# Patient Record
Sex: Male | Born: 1970 | Race: Black or African American | Hispanic: No | Marital: Single | Smoking: Never smoker
Health system: Southern US, Community
[De-identification: ages and names within clinical notes are randomized; demographics above are authoritative.]

## PROBLEM LIST (undated history)

## (undated) DIAGNOSIS — K859 Acute pancreatitis without necrosis or infection, unspecified: Secondary | ICD-10-CM

## (undated) DIAGNOSIS — I10 Essential (primary) hypertension: Secondary | ICD-10-CM

## (undated) DIAGNOSIS — E119 Type 2 diabetes mellitus without complications: Secondary | ICD-10-CM

## (undated) HISTORY — PX: HERNIA REPAIR: SHX51

---

## 2019-10-11 ENCOUNTER — Emergency Department (HOSPITAL_COMMUNITY)
Admission: EM | Admit: 2019-10-11 | Discharge: 2019-10-11 | Disposition: A | Discharge status: Home / Self Care | Payer: 59 | Attending: Emergency Medicine | Admitting: Emergency Medicine

## 2019-10-11 ENCOUNTER — Emergency Department (HOSPITAL_COMMUNITY): Payer: 59

## 2019-10-11 ENCOUNTER — Encounter (HOSPITAL_COMMUNITY): Payer: Self-pay | Admitting: Emergency Medicine

## 2019-10-11 DIAGNOSIS — Y999 Unspecified external cause status: Secondary | ICD-10-CM | POA: Diagnosis not present

## 2019-10-11 DIAGNOSIS — Y92411 Interstate highway as the place of occurrence of the external cause: Secondary | ICD-10-CM | POA: Diagnosis not present

## 2019-10-11 DIAGNOSIS — I10 Essential (primary) hypertension: Secondary | ICD-10-CM | POA: Insufficient documentation

## 2019-10-11 DIAGNOSIS — M549 Dorsalgia, unspecified: Secondary | ICD-10-CM | POA: Insufficient documentation

## 2019-10-11 DIAGNOSIS — Y93I9 Activity, other involving external motion: Secondary | ICD-10-CM | POA: Insufficient documentation

## 2019-10-11 DIAGNOSIS — E119 Type 2 diabetes mellitus without complications: Secondary | ICD-10-CM | POA: Diagnosis not present

## 2019-10-11 DIAGNOSIS — M542 Cervicalgia: Secondary | ICD-10-CM | POA: Diagnosis not present

## 2019-10-11 HISTORY — DX: Type 2 diabetes mellitus without complications: E11.9

## 2019-10-11 HISTORY — DX: Essential (primary) hypertension: I10

## 2019-10-11 MED ORDER — CYCLOBENZAPRINE HCL 10 MG PO TABS
10.0000 mg | ORAL_TABLET | Freq: Two times a day (BID) | ORAL | 0 refills | Status: DC | PRN
Start: 2019-10-11 — End: 2020-08-23

## 2019-10-11 MED ORDER — NAPROXEN 375 MG PO TABS
375.0000 mg | ORAL_TABLET | Freq: Two times a day (BID) | ORAL | 0 refills | Status: DC
Start: 2019-10-11 — End: 2020-08-23
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
## 2020-01-25 ENCOUNTER — Emergency Department (HOSPITAL_COMMUNITY)
Admission: EM | Admit: 2020-01-25 | Discharge: 2020-01-25 | Disposition: A | Discharge status: Home / Self Care | Payer: 59 | Attending: Emergency Medicine | Admitting: Emergency Medicine

## 2020-01-25 ENCOUNTER — Emergency Department (HOSPITAL_COMMUNITY): Payer: 59

## 2020-01-25 ENCOUNTER — Encounter (HOSPITAL_COMMUNITY): Payer: Self-pay

## 2020-01-25 ENCOUNTER — Other Ambulatory Visit: Payer: Self-pay

## 2020-01-25 DIAGNOSIS — I10 Essential (primary) hypertension: Secondary | ICD-10-CM | POA: Insufficient documentation

## 2020-01-25 DIAGNOSIS — E119 Type 2 diabetes mellitus without complications: Secondary | ICD-10-CM | POA: Diagnosis not present

## 2020-01-25 DIAGNOSIS — R1011 Right upper quadrant pain: Secondary | ICD-10-CM

## 2020-01-25 DIAGNOSIS — K529 Noninfective gastroenteritis and colitis, unspecified: Secondary | ICD-10-CM | POA: Diagnosis not present

## 2020-01-25 HISTORY — DX: Acute pancreatitis without necrosis or infection, unspecified: K85.90

## 2020-01-25 LAB — COMPREHENSIVE METABOLIC PANEL
ALT: 26 U/L (ref 0–44)
AST: 15 U/L (ref 15–41)
Albumin: 4.9 g/dL (ref 3.5–5.0)
Alkaline Phosphatase: 62 U/L (ref 38–126)
Anion gap: 12 (ref 5–15)
BUN: 16 mg/dL (ref 6–20)
CO2: 26 mmol/L (ref 22–32)
Calcium: 9.8 mg/dL (ref 8.9–10.3)
Chloride: 100 mmol/L (ref 98–111)
Creatinine, Ser: 1.11 mg/dL (ref 0.61–1.24)
GFR, Estimated: >60 mL/min (ref >60)
Glucose, Bld: 235 mg/dL — ABNORMAL HIGH (ref 70–99)
Potassium: 3.9 mmol/L (ref 3.5–5.1)
Sodium: 138 mmol/L (ref 135–145)
Total Bilirubin: 1 mg/dL (ref 0.3–1.2)
Total Protein: 7.8 g/dL (ref 6.5–8.1)

## 2020-01-25 LAB — URINALYSIS, ROUTINE W REFLEX MICROSCOPIC
Bacteria, UA: NONE SEEN
Bilirubin Urine: NEGATIVE
Glucose, UA: 50 mg/dL — AB
Hgb urine dipstick: NEGATIVE
Ketones, ur: 5 mg/dL — AB
Leukocytes,Ua: NEGATIVE
Nitrite: NEGATIVE
Protein, ur: 30 mg/dL — AB
Specific Gravity, Urine: 1.034 — ABNORMAL HIGH (ref 1.005–1.030)
pH: 5 (ref 5.0–8.0)

## 2020-01-25 LAB — CBC
HCT: 46.9 % (ref 39.0–52.0)
Hemoglobin: 15.6 g/dL (ref 13.0–17.0)
MCH: 29.8 pg (ref 26.0–34.0)
MCHC: 33.3 g/dL (ref 30.0–36.0)
MCV: 89.7 fL (ref 80.0–100.0)
Platelets: 229 10*3/uL (ref 150–400)
RBC: 5.23 MIL/uL (ref 4.22–5.81)
RDW: 12.3 % (ref 11.5–15.5)
WBC: 5.2 10*3/uL (ref 4.0–10.5)
nRBC: 0 % (ref 0.0–0.2)

## 2020-01-25 LAB — LIPASE, BLOOD: Lipase: 39 U/L (ref 11–51)

## 2020-01-25 MED ORDER — SODIUM CHLORIDE 0.9 % IV BOLUS
1000.0000 mL | Freq: Once | INTRAVENOUS | Status: AC
  Administered 2020-01-25: 1000 mL via INTRAVENOUS

## 2020-01-25 MED ORDER — ONDANSETRON 4 MG PO TBDP
4.0000 mg | ORAL_TABLET | Freq: Three times a day (TID) | ORAL | 0 refills | Status: DC | PRN
Start: 2020-01-25 — End: 2020-02-15

## 2020-01-25 MED ORDER — MORPHINE SULFATE (PF) 4 MG/ML IV SOLN
4.0000 mg | Freq: Once | INTRAVENOUS | Status: AC
  Administered 2020-01-25: 4 mg via INTRAVENOUS
  Filled 2020-01-25: qty 1

## 2020-01-25 MED ORDER — ONDANSETRON HCL 4 MG/2ML IJ SOLN
4.0000 mg | Freq: Once | INTRAMUSCULAR | Status: AC
  Administered 2020-01-25: 4 mg via INTRAVENOUS
  Filled 2020-01-25: qty 2

## 2020-01-25 MED ORDER — IOHEXOL 300 MG/ML  SOLN
100.0000 mL | Freq: Once | INTRAMUSCULAR | Status: AC | PRN
  Administered 2020-01-25: 100 mL via INTRAVENOUS

## 2020-01-25 NOTE — ED Notes (Signed)
Pt is tolerating ginger ale at this time

## 2020-01-25 NOTE — ED Provider Notes (Signed)
[facility] EMERGENCY DEPARTMENT Provider Note   CSN: [id number] Arrival date & time: 01/25/20  1208     History Chief Complaint  Patient presents with  . Abdominal Pain    Si Barris is a 49 y.o. male with past medical history significant for hypertension and diabetes.  No history of abdominal surgeries.  HPI  Patient is presenting to emergency department today with chief complaint of progressively worsening abdominal pain x 3 days. He states pain is located in RUQ and epigastric area. Pain does not radiate to his back.  His pain has been constant.  Describes the pain as sharp and a twisting sensation.  He rates the pain 1 of 10 in severity.  He has had decreased p.o. intake secondary to pain and nausea.  He took an over-the-counter pain medication at home with minimal symptom improvement.  He is endorsing nausea without emesis.  Denies any diarrhea, his last bowel movement was this morning and was normal for him.  He states this pain feels similar to when he had pancreatitis x2 weeks ago.  He denies any fever, chills, cough, shortness of breath, chest pain, palpitations, syncope,  back pain, urinary symptoms, diarrhea, blood in stool. He admits to drinking one glass of alcohol daily, has not drank since last hospital admission.  Chart review shows patient was admitted to an outside hospital for acute pancreatitis.  CT report mentions no signs of pancreatitis, showed diverticulosis without diverticulitis. The notes comment that it was an atypical presentation.  In the ED had a lipase in the 200s.  During admission he had an MRCP that was negative for any intrahepatic ductal dilation.  Has a family history of idiopathic pancreatitis.  He had an EGD that was negative for ulcer.  His triglycerides were 57.      Past Medical History:  Diagnosis Date  . Diabetes mellitus without complication (HCC)   . Hypertension   . Pancreatitis     There are no problems to display  for this patient.   History reviewed. No pertinent surgical history.     No family history on file.  Social History   Tobacco Use  . Smoking status: Never Smoker  . Smokeless tobacco: Never Used  Substance Use Topics  . Alcohol use: Yes  . Drug use: Not Currently    Home Medications Prior to Admission medications   Medication Sig Start Date End Date Taking? Authorizing Provider  cyclobenzaprine (FLEXERIL) 10 MG tablet Take 1 tablet (10 mg total) by mouth 2 (two) times daily as needed for muscle spasms. 10/11/19   Farrel Gordon, PA-C  naproxen (NAPROSYN) 375 MG tablet Take 1 tablet (375 mg total) by mouth 2 (two) times daily. 10/11/19   Farrel Gordon, PA-C  ondansetron (ZOFRAN ODT) 4 MG disintegrating tablet Take 1 tablet (4 mg total) by mouth every 8 (eight) hours as needed for nausea or vomiting. 01/25/20   Ryah Cribb, Caroleen Hamman, PA-C    Allergies    Penicillins  Review of Systems   Review of Systems  All other systems are reviewed and are negative for acute change except as noted in the HPI.   Physical Exam Updated Vital Signs BP (!) 156/78   Pulse 73   Temp 98.3 F (36.8 C) (Oral)   Resp 16   Ht 6\' 2"  (1.88 m)   Wt 98.9 kg   SpO2 100%   BMI 27.99 kg/m   Physical Exam Vitals and nursing note reviewed.  Constitutional:  General: He is not in acute distress.    Appearance: He is not ill-appearing.  HENT:     Head: Normocephalic and atraumatic.     Right Ear: Tympanic membrane and external ear normal.     Left Ear: Tympanic membrane and external ear normal.     Nose: Nose normal.     Mouth/Throat:     Mouth: Mucous membranes are moist.     Pharynx: Oropharynx is clear.  Eyes:     General: No scleral icterus.       Right eye: No discharge.        Left eye: No discharge.     Extraocular Movements: Extraocular movements intact.     Conjunctiva/sclera: Conjunctivae normal.     Pupils: Pupils are equal, round, and reactive to light.  Neck:     Vascular:  No JVD.  Cardiovascular:     Rate and Rhythm: Normal rate and regular rhythm.     Pulses: Normal pulses.          Radial pulses are 2+ on the right side and 2+ on the left side.     Heart sounds: Normal heart sounds.  Pulmonary:     Comments: Lungs clear to auscultation in all fields. Symmetric chest rise. No wheezing, rales, or rhonchi. Abdominal:     General: Bowel sounds are normal.     Tenderness: There is abdominal tenderness in the right upper quadrant and epigastric area.     Comments: Abdomen is soft, non-distended. Voluntary guarding. No rigidity. No peritoneal signs.  Musculoskeletal:        General: Normal range of motion.     Cervical back: Normal range of motion.  Skin:    General: Skin is warm and dry.     Capillary Refill: Capillary refill takes less than 2 seconds.  Neurological:     Mental Status: He is oriented to person, place, and time.     GCS: GCS eye subscore is 4. GCS verbal subscore is 5. GCS motor subscore is 6.     Comments: Fluent speech, no facial droop.  Psychiatric:        Behavior: Behavior normal.     ED Results / Procedures / Treatments   Labs (all labs ordered are listed, but only abnormal results are displayed) Labs Reviewed  COMPREHENSIVE METABOLIC PANEL - Abnormal; Notable for the following components:      Result Value   Glucose, Bld 235 (*)    All other components within normal limits  URINALYSIS, ROUTINE W REFLEX MICROSCOPIC - Abnormal; Notable for the following components:   Color, Urine AMBER (*)    Specific Gravity, Urine 1.034 (*)    Glucose, UA 50 (*)    Ketones, ur 5 (*)    Protein, ur 30 (*)    All other components within normal limits  LIPASE, BLOOD  CBC    EKG None  Radiology CT ABDOMEN PELVIS W CONTRAST  Result Date: 01/25/2020 CLINICAL DATA:  Epigastric pain for several days EXAM: CT ABDOMEN AND PELVIS WITH CONTRAST TECHNIQUE: Multidetector CT imaging of the abdomen and pelvis was performed using the standard  protocol following bolus administration of intravenous contrast. CONTRAST:  OMNIPAQUE IOHEXOL 300 MG/ML  SOLN COMPARISON:  Ultrasound from earlier in the same day. FINDINGS: Lower chest: No acute abnormality. Hepatobiliary: Fatty infiltration of the liver is noted. Gallbladder appears within normal limits. The gallstone seen on recent ultrasound are not well appreciated. Pancreas: Unremarkable. No pancreatic ductal dilatation or  surrounding inflammatory changes. Spleen: Normal in size without focal abnormality. Adrenals/Urinary Tract: Adrenal glands are within normal limits. Kidneys demonstrate a normal enhancement pattern bilaterally. No renal calculi or obstructive changes are seen. 1 cm renal cyst is noted posteriorly on the right. The bladder is partially distended. Stomach/Bowel: The appendix is well visualized and within normal limits. Colon shows no obstructive or inflammatory changes. Stomach is decompressed. A few fluid-filled loops of small bowel are noted although no obstructive changes are seen. This may represent a mild enteritis. Vascular/Lymphatic: Aortic atherosclerosis. No enlarged abdominal or pelvic lymph nodes. Reproductive: Prostate is unremarkable. Other: No abdominal wall hernia or abnormality. No abdominopelvic ascites. Musculoskeletal: No acute or significant osseous findings. IMPRESSION: Few fluid-filled loops of small bowel without definitive obstruction. This may represent a mild enteritis. Previously seen gallstones are not well visualized. Fatty liver. Electronically Signed   By: Alcide CleverMark  Lukens M.D.   On: 01/25/2020 17:58   US Abdomen Limited RUQ  Result Date: 01/25/2020 CLINICAL DATA:  49 year old male with right upper quadrant pain since last night. EXAM: ULTRASOUND ABDOMEN LIMITED RIGHT UPPER QUADRANT COMPARISON:  None. FINDINGS: Gallbladder: Shadowing echogenic gallstones, individually estimated at 6 mm (image 4). Gallbladder wall thickness remains normal. However, there is  trace pericholecystic fluid suspected (image 23). But no sonographic Eulah PontMurphy sign is elicited. Common bile duct: Diameter: 3 mm, normal. Liver: Background liver echogenicity at the upper limits of normal to mildly increased. Probable small geographic area of fatty sparing near the gallbladder fossa (image 41). No other discrete liver lesion. No intrahepatic biliary ductal dilatation. Portal vein is patent on color Doppler imaging with normal direction of blood flow towards the liver. Other: Visible right kidney remarkable for a small 9 mm simple appearing upper pole cyst (image 54). IMPRESSION: 1. Positive for cholelithiasis and trace pericholecystic fluid, raising the possibility of mild acute cholecystitis. However, gallbladder wall thickness seems to remain normal and no sonographic Murphy sign was elicited. 2. No evidence of bile duct obstruction. 3. Fatty liver disease suspected with focal fatty sparing adjacent to the gallbladder fossa. Electronically Signed   By: Odessa FlemingH  Hall M.D.   On: 01/25/2020 15:30    Procedures Procedures (including critical care time)  Medications Ordered in ED Medications  sodium chloride 0.9 % bolus 1,000 mL (0 mLs Intravenous Stopped 01/25/20 1627)  morphine 4 MG/ML injection 4 mg (4 mg Intravenous Given 01/25/20 1415)  ondansetron (ZOFRAN) injection 4 mg (4 mg Intravenous Given 01/25/20 1415)  ondansetron (ZOFRAN) injection 4 mg (4 mg Intravenous Given 01/25/20 1641)  iohexol (OMNIPAQUE) 300 MG/ML solution 100 mL (100 mLs Intravenous Contrast Given 01/25/20 1725)    ED Course  I have reviewed the triage vital signs and the nursing notes.  Pertinent labs & imaging results that were available during my care of the patient were reviewed by me and considered in my medical decision making (see chart for details).    MDM Rules/Calculators/A&P                          History provided by patient with additional history obtained from chart review.    Patient presents to  the ED with complaints of abdominal pain. Patient nontoxic appearing, in no apparent distress, vitals WNL. On exam patient tender to RUQ and epigastric area, no peritoneal signs. Will evaluate with labs and US. Analgesics, anti-emetics, and fluids administered.   Labs reviewed and grossly unremarkable. No leukocytosis, no anemia, no significant electrolyte derangements. LFTs,  renal function, and lipase WNL. Urinalysis without obvious infection.   RUQ [place] is positive for cholelithiasis and trace pericholecystic fluid, raising the possibility of mild acute cholecystitis.  Reassessed patient he is still having right upper quadrant pain.  With his recent pancreatitis we will proceed with CT abdomen pelvis.  CT abdomen pelvis shows what looks to be enteritis.  He has no signs of small bowel obstruction.  On repeat abdominal exam patient remains without peritoneal signs, doubt cholecystitis, pancreatitis, diverticulitis, appendicitis, bowel obstruction/perforation. Patient tolerating PO in the emergency department. Will discharge home with supportive measures. I discussed results, treatment plan, need for PCP and GI follow-up, and return precautions with the patient. Provided opportunity for questions, patient confirmed understanding and is in agreement with plan. Findings and plan of care discussed with supervising physician Dr. Judd Lien.     Portions of this note were generated with Scientist, clinical (histocompatibility and immunogenetics). Dictation errors may occur despite best attempts at proofreading.   Final Clinical Impression(s) / ED Diagnoses Final diagnoses:  RUQ abdominal pain  Enteritis    Rx / DC Orders ED Discharge Orders         Ordered    ondansetron (ZOFRAN ODT) 4 MG disintegrating tablet  Every 8 hours PRN        01/25/20 1822           Sherene Sires, PA-C 01/25/20 1832    Geoffery Lyons, MD 01/30/20 [phone]

## 2020-01-25 NOTE — ED Notes (Signed)
Reviewed discharge instruction with patient, pt states verbal understanding and pt has not questions at this time

## 2020-01-25 NOTE — Discharge Instructions (Addendum)
CT scan today showed you have what looks to be enteritis.  That means inflammation of the small intestine.As we talked about this can be caused by a virus or bacterial.  At this time it seems that yours is viral.  Your symptoms will improve if you take over-the-counter Tylenol and ibuprofen.  Prescription sent to your pharmacy for Zofran.  This is for nausea.  Take if needed.  I have given you reading information about colitis which means inflammation of the colon. This is different but treated the same way  Your best to stay well-hydrated.  Drink plenty of water.  Advance your diet as tolerated.  Follow-up with the GI doctors if you need to. I have given you information for the GI doctors on call today.  You can call their office to schedule a follow-up appointment.   Thank you for allowing [place] to care for you today

## 2020-01-25 NOTE — ED Triage Notes (Signed)
Pt reports RUQ pain for the past few days with nausea. Hx of pancreatitis, states he was admitted 2 weeks ago for the same.

## 2020-01-29 ENCOUNTER — Encounter: Payer: Self-pay | Admitting: Gastroenterology

## 2020-02-02 ENCOUNTER — Ambulatory Visit: Payer: 59 | Admitting: Family Medicine

## 2020-02-09 ENCOUNTER — Ambulatory Visit: Payer: 59 | Admitting: Family Medicine

## 2020-02-14 ENCOUNTER — Emergency Department (HOSPITAL_COMMUNITY)
Admission: EM | Admit: 2020-02-14 | Discharge: 2020-02-15 | Disposition: A | Discharge status: Home / Self Care | Payer: 59 | Attending: Emergency Medicine | Admitting: Emergency Medicine

## 2020-02-14 ENCOUNTER — Encounter (HOSPITAL_COMMUNITY): Payer: Self-pay | Admitting: Emergency Medicine

## 2020-02-14 ENCOUNTER — Other Ambulatory Visit: Payer: Self-pay

## 2020-02-14 DIAGNOSIS — R112 Nausea with vomiting, unspecified: Secondary | ICD-10-CM | POA: Diagnosis not present

## 2020-02-14 DIAGNOSIS — E1165 Type 2 diabetes mellitus with hyperglycemia: Secondary | ICD-10-CM | POA: Insufficient documentation

## 2020-02-14 DIAGNOSIS — R7989 Other specified abnormal findings of blood chemistry: Secondary | ICD-10-CM

## 2020-02-14 DIAGNOSIS — I10 Essential (primary) hypertension: Secondary | ICD-10-CM | POA: Insufficient documentation

## 2020-02-14 DIAGNOSIS — R739 Hyperglycemia, unspecified: Secondary | ICD-10-CM

## 2020-02-14 DIAGNOSIS — R1013 Epigastric pain: Secondary | ICD-10-CM | POA: Insufficient documentation

## 2020-02-14 LAB — COMPREHENSIVE METABOLIC PANEL
ALT: 41 U/L (ref 0–44)
AST: 26 U/L (ref 15–41)
Albumin: 5.1 g/dL — ABNORMAL HIGH (ref 3.5–5.0)
Alkaline Phosphatase: 75 U/L (ref 38–126)
Anion gap: 13 (ref 5–15)
BUN: 20 mg/dL (ref 6–20)
CO2: 31 mmol/L (ref 22–32)
Calcium: 9.9 mg/dL (ref 8.9–10.3)
Chloride: 92 mmol/L — ABNORMAL LOW (ref 98–111)
Creatinine, Ser: 1.34 mg/dL — ABNORMAL HIGH (ref 0.61–1.24)
GFR, Estimated: >60 mL/min (ref >60)
Glucose, Bld: 290 mg/dL — ABNORMAL HIGH (ref 70–99)
Potassium: 3.4 mmol/L — ABNORMAL LOW (ref 3.5–5.1)
Sodium: 136 mmol/L (ref 135–145)
Total Bilirubin: 1.2 mg/dL (ref 0.3–1.2)
Total Protein: 8 g/dL (ref 6.5–8.1)

## 2020-02-14 LAB — URINALYSIS, ROUTINE W REFLEX MICROSCOPIC
Bacteria, UA: NONE SEEN
Bilirubin Urine: NEGATIVE
Glucose, UA: >=500 mg/dL — AB
Hgb urine dipstick: NEGATIVE
Ketones, ur: NEGATIVE mg/dL
Leukocytes,Ua: NEGATIVE
Nitrite: NEGATIVE
Protein, ur: 30 mg/dL — AB
Specific Gravity, Urine: 1.024 (ref 1.005–1.030)
pH: 5 (ref 5.0–8.0)

## 2020-02-14 LAB — CBC
HCT: 47.6 % (ref 39.0–52.0)
Hemoglobin: 16.2 g/dL (ref 13.0–17.0)
MCH: 30.1 pg (ref 26.0–34.0)
MCHC: 34 g/dL (ref 30.0–36.0)
MCV: 88.3 fL (ref 80.0–100.0)
Platelets: 274 10*3/uL (ref 150–400)
RBC: 5.39 MIL/uL (ref 4.22–5.81)
RDW: 12.4 % (ref 11.5–15.5)
WBC: 7.3 10*3/uL (ref 4.0–10.5)
nRBC: 0 % (ref 0.0–0.2)

## 2020-02-14 LAB — LIPASE, BLOOD: Lipase: 37 U/L (ref 11–51)

## 2020-02-14 NOTE — ED Triage Notes (Signed)
Patient reports mid abdominal pain with emesis this afternoon , denies fever or diarrhea .

## 2020-02-15 ENCOUNTER — Encounter (HOSPITAL_COMMUNITY): Payer: Self-pay | Admitting: Student

## 2020-02-15 LAB — RAPID URINE DRUG SCREEN, HOSP PERFORMED
Amphetamines: NOT DETECTED
Barbiturates: NOT DETECTED
Benzodiazepines: NOT DETECTED
Cocaine: NOT DETECTED
Opiates: NOT DETECTED
Tetrahydrocannabinol: POSITIVE — AB

## 2020-02-15 MED ORDER — ONDANSETRON 4 MG PO TBDP: 4.0000 mg | ORAL_TABLET | Freq: Three times a day (TID) | ORAL | 0 refills | Status: DC | PRN

## 2020-02-15 MED ORDER — POTASSIUM CHLORIDE CRYS ER 20 MEQ PO TBCR: 20.0000 meq | EXTENDED_RELEASE_TABLET | Freq: Every day | ORAL | 0 refills | Status: DC

## 2020-02-15 MED ORDER — METOCLOPRAMIDE HCL 5 MG/ML IJ SOLN
10.0000 mg | Freq: Once | INTRAMUSCULAR | Status: AC
  Administered 2020-02-15: 10 mg via INTRAVENOUS
  Filled 2020-02-15: qty 2

## 2020-02-15 MED ORDER — SUCRALFATE 1 GM/10ML PO SUSP: 1.0000 g | Freq: Three times a day (TID) | ORAL | 0 refills | Status: DC

## 2020-02-15 MED ORDER — FAMOTIDINE IN NACL 20-0.9 MG/50ML-% IV SOLN
20.0000 mg | Freq: Once | INTRAVENOUS | Status: AC
  Administered 2020-02-15: 20 mg via INTRAVENOUS
  Filled 2020-02-15: qty 50

## 2020-02-15 MED ORDER — SODIUM CHLORIDE 0.9 % IV BOLUS
1000.0000 mL | Freq: Once | INTRAVENOUS | Status: AC
  Administered 2020-02-15: 1000 mL via INTRAVENOUS

## 2020-02-15 MED ORDER — ALUM & MAG HYDROXIDE-SIMETH 200-200-20 MG/5ML PO SUSP
30.0000 mL | Freq: Once | ORAL | Status: AC
  Administered 2020-02-15: 30 mL via ORAL
  Filled 2020-02-15: qty 30

## 2020-02-15 MED ORDER — DIPHENHYDRAMINE HCL 50 MG/ML IJ SOLN
12.5000 mg | Freq: Once | INTRAMUSCULAR | Status: AC
  Administered 2020-02-15: 12.5 mg via INTRAVENOUS
  Filled 2020-02-15: qty 1

## 2020-02-15 MED ORDER — PANTOPRAZOLE SODIUM 40 MG PO TBEC: 40.0000 mg | DELAYED_RELEASE_TABLET | Freq: Every day | ORAL | 0 refills | Status: DC

## 2020-02-15 MED ORDER — HALOPERIDOL LACTATE 5 MG/ML IJ SOLN
5.0000 mg | Freq: Once | INTRAMUSCULAR | Status: AC
  Administered 2020-02-15: 5 mg via INTRAVENOUS
  Filled 2020-02-15: qty 1

## 2020-02-15 MED ORDER — LIDOCAINE VISCOUS HCL 2 % MT SOLN
15.0000 mL | Freq: Once | OROMUCOSAL | Status: AC
  Administered 2020-02-15: 15 mL via ORAL
  Filled 2020-02-15: qty 15

## 2020-02-15 NOTE — Discharge Instructions (Signed)
You are seen in the emergency department today for abdominal pain and nausea and vomiting.  Your work-up was overall reassuring.  Your labs did show that your creatinine, a measure of kidney function, was increased which can happen for dehydration, you are given fluids to help with this, please have this rechecked by your primary care provider.  Your potassium was mildly low, we are sending you home with a few days of a supplement diet guidelines, I this rechecked as well.  Your EKG that we did was mildly abnormal, for this reason we would like you to follow-up with cardiology for a repeat EKG and possible further assessment.  Your pancreas enzyme (lipase) was normal which is reassuring.  Your blood sugar was elevated, please be sure to monitor this at home and have this rechecked as well.  We are sending you home with the following medications to help with your symptoms:  - Protonix- please take 1 tablet in the morning prior to any meals to help with stomach acidity/pain.  - Carafate- please take prior to each meal and prior to bedtime to help with stomach acidity/pain.  - Zofran- please take every 8 hours as needed for nausea/vomiting.  - Potassium- take 1 tablet daily for the next few days to help with low potassium level noted on your blood work.   We have prescribed you new medication(s) today. Discuss the medications prescribed today with your pharmacist as they can have adverse effects and interactions with your other medicines including over the counter and prescribed medications. Seek medical evaluation if you start to experience new or abnormal symptoms after taking one of these medicines, seek care immediately if you start to experience difficulty breathing, feeling of your throat closing, facial swelling, or rash as these could be indications of a more serious allergic reaction  Please follow attached diet guidelines.   Please avoid alcohol and marijuana as each of these things can further  irritate your stomach.  Follow up with your primary care provider within 3 days for re-evaluation.  Return to the ER for new or worsening symptoms including but not limited to worsened pain, new pain, inability to keep fluids down, blood in vomit/stool, passing out, or any other concerns.

## 2020-02-15 NOTE — ED Provider Notes (Signed)
[facility] EMERGENCY DEPARTMENT Provider Note   CSN: [id number] Arrival date & time: 02/14/20  2158     History Chief Complaint  Patient presents with  . Abdominal Pain    Edward Wise is a 49 y.o. male with a history of Dm, hypertension, & pancreatitis who presents to the ED with complaints of abdominal pain that began @ 17:30 today. Patient states pain is primarily in the epigastrium, cramping in nature, constant, progressively worsening, & associated w/ nausea & too numerous to count episodes of emesis. Sxs worse with attempts at PO. Denies fever, chills, hematemesis, diarrhea, melena, hematochezia, dysuria, testicular pain/swelling, dyspnea, or chest pain. Reports occasional EtOH, denies drug use. States sxs feel exactly the same as recent ED visit last month.   HPI     Past Medical History:  Diagnosis Date  . Diabetes mellitus without complication (HCC)   . Hypertension   . Pancreatitis     There are no problems to display for this patient.   History reviewed. No pertinent surgical history.     No family history on file.  Social History   Tobacco Use  . Smoking status: Never Smoker  . Smokeless tobacco: Never Used  Substance Use Topics  . Alcohol use: Yes  . Drug use: Not Currently    Home Medications Prior to Admission medications   Medication Sig Start Date End Date Taking? Authorizing Provider  cyclobenzaprine (FLEXERIL) 10 MG tablet Take 1 tablet (10 mg total) by mouth 2 (two) times daily as needed for muscle spasms. 10/11/19   Farrel Gordon, PA-C  naproxen (NAPROSYN) 375 MG tablet Take 1 tablet (375 mg total) by mouth 2 (two) times daily. 10/11/19   Farrel Gordon, PA-C  ondansetron (ZOFRAN ODT) 4 MG disintegrating tablet Take 1 tablet (4 mg total) by mouth every 8 (eight) hours as needed for nausea or vomiting. 01/25/20   Shanon Ace, PA-C    Allergies    Penicillins  Review of Systems   Review of Systems    Constitutional: Negative for chills and fever.  Respiratory: Negative for shortness of breath.   Cardiovascular: Negative for chest pain.  Gastrointestinal: Positive for abdominal pain, nausea and vomiting. Negative for anal bleeding, blood in stool and diarrhea.  Genitourinary: Negative for dysuria, scrotal swelling and testicular pain.  Neurological: Negative for syncope.  All other systems reviewed and are negative.   Physical Exam Updated Vital Signs BP (!) 168/110 (BP Location: Right Arm)   Pulse 98   Temp 98.3 F (36.8 C) (Oral)   Resp (!) 22   Ht 6\' 2"  (1.88 m)   Wt 109 kg   SpO2 100%   BMI 30.85 kg/m   Physical Exam Vitals and nursing note reviewed.  Constitutional:      General: He is not in acute distress.    Appearance: He is well-developed. He is not toxic-appearing.  HENT:     Head: Normocephalic and atraumatic.  Eyes:     General:        Right eye: No discharge.        Left eye: No discharge.     Conjunctiva/sclera: Conjunctivae normal.  Cardiovascular:     Rate and Rhythm: Normal rate and regular rhythm.  Pulmonary:     Effort: Pulmonary effort is normal. No respiratory distress.     Breath sounds: Normal breath sounds. No wheezing, rhonchi or rales.  Abdominal:     General: There is no distension.     Palpations:  Abdomen is soft.     Tenderness: There is abdominal tenderness (mild generalized, worse in the epigastrium). There is no guarding or rebound. Negative signs include Murphy's sign and McBurney's sign.  Musculoskeletal:     Cervical back: Neck supple.  Skin:    General: Skin is warm and dry.     Findings: No rash.  Neurological:     Mental Status: He is alert.     Comments: Clear speech.   Psychiatric:        Behavior: Behavior normal.     ED Results / Procedures / Treatments   Labs (all labs ordered are listed, but only abnormal results are displayed) Labs Reviewed  COMPREHENSIVE METABOLIC PANEL - Abnormal; Notable for the  following components:      Result Value   Potassium 3.4 (*)    Chloride 92 (*)    Glucose, Bld 290 (*)    Creatinine, Ser 1.34 (*)    Albumin 5.1 (*)    All other components within normal limits  URINALYSIS, ROUTINE W REFLEX MICROSCOPIC - Abnormal; Notable for the following components:   Glucose, UA >=500 (*)    Protein, ur 30 (*)    All other components within normal limits  RAPID URINE DRUG SCREEN, HOSP PERFORMED - Abnormal; Notable for the following components:   Tetrahydrocannabinol POSITIVE (*)    All other components within normal limits  LIPASE, BLOOD  CBC    EKG None  Radiology No results found.  Procedures Procedures (including critical care time)  Medications Ordered in ED Medications  sodium chloride 0.9 % bolus 1,000 mL (0 mLs Intravenous Stopped 02/15/20 0138)  famotidine (PEPCID) IVPB 20 mg premix (0 mg Intravenous Stopped 02/15/20 0138)  metoCLOPramide (REGLAN) injection 10 mg (10 mg Intravenous Given 02/15/20 0058)  diphenhydrAMINE (BENADRYL) injection 12.5 mg (12.5 mg Intravenous Given 02/15/20 0058)  haloperidol lactate (HALDOL) injection 5 mg (5 mg Intravenous Given 02/15/20 0136)  alum & mag hydroxide-simeth (MAALOX/MYLANTA) 200-200-20 MG/5ML suspension 30 mL (30 mLs Oral Given 02/15/20 0238)    And  lidocaine (XYLOCAINE) 2 % viscous mouth solution 15 mL (15 mLs Oral Given 02/15/20 0238)    ED Course  I have reviewed the triage vital signs and the nursing notes.  Pertinent labs & imaging results that were available during my care of the patient were reviewed by me and considered in my medical decision making (see chart for details).    MDM Rules/Calculators/A&P                         Patient presents to the ED with complaints of N/V & abdominal pain.  Nontoxic, BP elevated- doubt HTN emergency.  Exam w/ mild generalized tenderness, increased tenderness to the epigastrium. No peritoneal signs.   Additional history obtained:  Additional history  obtained from chart review & nursing note review. Previous records obtained and reviewed - seen in the ED 01/25/20 for what patient states were same sxs, had RUQ [place] & CT A/P at that time which were reviewed.   Lab Tests:  I reviewed and interpreted labs, which included:  CBC: No significant anemia or leukocytosis.  CMP: Mild hypokalemia. Hyperglycemic without acidosis or anion gap elevation. Mild elevation in creatinine compared to prior.- fluids ordered.  Lipase: WNL UA: No UTI. No Ketonuria   Labs not consistent w/ DKA. No significant lipase or LFT elevation. No significant leukocytosis.  Given recent visit with what patient states are similar symptoms will hold  off on any imaging and treat symptomatically.Initially ordered fluids, Pepcid, Reglan, and Benadryl for symptomatic care.  Will obtain EKG as well to assess QTC.  Patient has an abnormal EKG, no current chest pain or shortness of breath, we will have him follow-up with cardiology in regards to this.  His QTC is 465, will proceed with Haldol at this time given positive for tetrahydrocannabinol on UDS.  03:15: RE-EVAL: Patient is feeling much better, he states he feels ready to go home, remains without peritoneal signs on abdominal exam, I have a low suspicion for acute surgical process.  Low suspicion for cholecystitis, appendicitis, perforation, obstruction, pancreatitis, or diverticulitis.  Possibly GERD, PUD, also considering marijuana induced based on UDS.  Given improvement in his symptoms and reassuring repeat abdominal exam feel patient is appropriate for discharge home at this time. I discussed results, treatment plan, need for follow-up, and return precautions with the patient. Provided opportunity for questions, patient confirmed understanding and is in agreement with plan.   Findings and plan of care discussed with supervising physician Dr. Nicanor Alcon who is in agreement.   Portions of this note were generated with [employer]. Dictation errors may occur despite best attempts at proofreading.  Final Clinical Impression(s) / ED Diagnoses Final diagnoses:  Epigastric pain  Non-intractable vomiting with nausea, unspecified vomiting type  Hyperglycemia  Elevated serum creatinine    Rx / DC Orders ED Discharge Orders         Ordered    potassium chloride SA (KLOR-CON) 20 MEQ tablet  Daily        02/15/20 0321    ondansetron (ZOFRAN ODT) 4 MG disintegrating tablet  Every 8 hours PRN        02/15/20 0321    sucralfate (CARAFATE) 1 GM/10ML suspension  3 times daily with meals & bedtime        02/15/20 0321    pantoprazole (PROTONIX) 40 MG tablet  Daily        02/15/20 0321           Cherly Anderson, PA-C 02/15/20 0335    Palumbo, April, MD 02/15/20 0401

## 2020-02-23 ENCOUNTER — Ambulatory Visit: Payer: 59 | Admitting: Family Medicine

## 2020-03-04 ENCOUNTER — Other Ambulatory Visit: Payer: Self-pay

## 2020-03-04 ENCOUNTER — Encounter: Payer: Self-pay | Admitting: Family Medicine

## 2020-03-04 ENCOUNTER — Ambulatory Visit: Payer: 59 | Admitting: Family Medicine

## 2020-03-04 DIAGNOSIS — G8929 Other chronic pain: Secondary | ICD-10-CM | POA: Diagnosis not present

## 2020-03-04 DIAGNOSIS — M5442 Lumbago with sciatica, left side: Secondary | ICD-10-CM | POA: Diagnosis not present

## 2020-03-04 MED ORDER — BACLOFEN 10 MG PO TABS
5.0000 mg | ORAL_TABLET | Freq: Every evening | ORAL | 3 refills | Status: DC | PRN
Start: 2020-03-04 — End: 2020-07-29
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
## 2020-03-21 ENCOUNTER — Other Ambulatory Visit: Payer: Self-pay

## 2020-03-21 ENCOUNTER — Ambulatory Visit
Admission: RE | Admit: 2020-03-21 | Discharge: 2020-03-21 | Disposition: A | Discharge status: Home / Self Care | Payer: 59 | Source: Ambulatory Visit | Attending: Family Medicine | Admitting: Family Medicine

## 2020-03-21 DIAGNOSIS — G8929 Other chronic pain: Secondary | ICD-10-CM

## 2020-03-21 DIAGNOSIS — M5442 Lumbago with sciatica, left side: Secondary | ICD-10-CM

## 2020-03-22 ENCOUNTER — Telehealth: Payer: Self-pay | Admitting: Urgent Care

## 2020-03-22 ENCOUNTER — Telehealth: Payer: Self-pay | Admitting: Family Medicine

## 2020-03-22 NOTE — Telephone Encounter (Signed)
The main finding on lumbar MRI is a disc bulge at L4-5 which could potentially irritate the right and left L4 nerve roots.  No clear-cut indication for surgery based on results.  Hopefully this will resolve with time and treatment.  Could consider either referral for an epidural injection, or another round of chiropractic, or a trial of physical therapy.

## 2020-03-22 NOTE — Telephone Encounter (Signed)
I advised the patient of the results and options for treatment. He would like to think on this and then give [place] a call with how he would like to proceed.

## 2020-03-22 NOTE — Telephone Encounter (Signed)
I called and left a voice mail to call me back for results.

## 2020-04-08 ENCOUNTER — Ambulatory Visit: Payer: 59 | Admitting: Gastroenterology

## 2020-05-06 NOTE — Telephone Encounter (Signed)
error 

## 2020-07-27 ENCOUNTER — Other Ambulatory Visit: Payer: Self-pay | Admitting: Family Medicine

## 2020-08-19 ENCOUNTER — Emergency Department (HOSPITAL_COMMUNITY): Payer: 59

## 2020-08-19 ENCOUNTER — Encounter (HOSPITAL_COMMUNITY): Payer: Self-pay

## 2020-08-19 ENCOUNTER — Other Ambulatory Visit: Payer: Self-pay

## 2020-08-19 ENCOUNTER — Inpatient Hospital Stay (HOSPITAL_COMMUNITY)
Admission: EM | Admit: 2020-08-19 | Discharge: 2020-08-23 | DRG: 419 | Disposition: A | Discharge status: Home / Self Care | Payer: 59 | Attending: Internal Medicine | Admitting: Internal Medicine

## 2020-08-19 DIAGNOSIS — Z8249 Family history of ischemic heart disease and other diseases of the circulatory system: Secondary | ICD-10-CM

## 2020-08-19 DIAGNOSIS — K660 Peritoneal adhesions (postprocedural) (postinfection): Secondary | ICD-10-CM | POA: Diagnosis present

## 2020-08-19 DIAGNOSIS — I700 Atherosclerosis of aorta: Secondary | ICD-10-CM | POA: Diagnosis present

## 2020-08-19 DIAGNOSIS — Z7984 Long term (current) use of oral hypoglycemic drugs: Secondary | ICD-10-CM

## 2020-08-19 DIAGNOSIS — R739 Hyperglycemia, unspecified: Secondary | ICD-10-CM | POA: Diagnosis present

## 2020-08-19 DIAGNOSIS — Z79899 Other long term (current) drug therapy: Secondary | ICD-10-CM | POA: Diagnosis not present

## 2020-08-19 DIAGNOSIS — K8020 Calculus of gallbladder without cholecystitis without obstruction: Secondary | ICD-10-CM | POA: Diagnosis present

## 2020-08-19 DIAGNOSIS — Z9049 Acquired absence of other specified parts of digestive tract: Secondary | ICD-10-CM | POA: Diagnosis not present

## 2020-08-19 DIAGNOSIS — R748 Abnormal levels of other serum enzymes: Secondary | ICD-10-CM

## 2020-08-19 DIAGNOSIS — I10 Essential (primary) hypertension: Secondary | ICD-10-CM | POA: Diagnosis present

## 2020-08-19 DIAGNOSIS — K851 Biliary acute pancreatitis without necrosis or infection: Secondary | ICD-10-CM | POA: Diagnosis present

## 2020-08-19 DIAGNOSIS — Z20822 Contact with and (suspected) exposure to covid-19: Secondary | ICD-10-CM | POA: Diagnosis present

## 2020-08-19 DIAGNOSIS — Z880 Allergy status to penicillin: Secondary | ICD-10-CM | POA: Diagnosis not present

## 2020-08-19 DIAGNOSIS — E876 Hypokalemia: Secondary | ICD-10-CM | POA: Clinically undetermined

## 2020-08-19 DIAGNOSIS — K760 Fatty (change of) liver, not elsewhere classified: Secondary | ICD-10-CM | POA: Diagnosis present

## 2020-08-19 DIAGNOSIS — E1141 Type 2 diabetes mellitus with diabetic mononeuropathy: Secondary | ICD-10-CM | POA: Diagnosis present

## 2020-08-19 DIAGNOSIS — R112 Nausea with vomiting, unspecified: Secondary | ICD-10-CM

## 2020-08-19 DIAGNOSIS — E1165 Type 2 diabetes mellitus with hyperglycemia: Secondary | ICD-10-CM | POA: Diagnosis present

## 2020-08-19 DIAGNOSIS — R1010 Upper abdominal pain, unspecified: Secondary | ICD-10-CM

## 2020-08-19 DIAGNOSIS — Z833 Family history of diabetes mellitus: Secondary | ICD-10-CM | POA: Diagnosis not present

## 2020-08-19 LAB — COMPREHENSIVE METABOLIC PANEL
ALT: 26 U/L (ref 0–44)
AST: 21 U/L (ref 15–41)
Albumin: 5.2 g/dL — ABNORMAL HIGH (ref 3.5–5.0)
Alkaline Phosphatase: 81 U/L (ref 38–126)
Anion gap: 16 — ABNORMAL HIGH (ref 5–15)
BUN: 27 mg/dL — ABNORMAL HIGH (ref 6–20)
CO2: 23 mmol/L (ref 22–32)
Calcium: 10 mg/dL (ref 8.9–10.3)
Chloride: 93 mmol/L — ABNORMAL LOW (ref 98–111)
Creatinine, Ser: 1.23 mg/dL (ref 0.61–1.24)
GFR, Estimated: >60 mL/min (ref >60)
Glucose, Bld: 430 mg/dL — ABNORMAL HIGH (ref 70–99)
Potassium: 4.1 mmol/L (ref 3.5–5.1)
Sodium: 132 mmol/L — ABNORMAL LOW (ref 135–145)
Total Bilirubin: 1.2 mg/dL (ref 0.3–1.2)
Total Protein: 9 g/dL — ABNORMAL HIGH (ref 6.5–8.1)

## 2020-08-19 LAB — URINALYSIS, ROUTINE W REFLEX MICROSCOPIC
Bacteria, UA: NONE SEEN
Bilirubin Urine: NEGATIVE
Glucose, UA: >=500 mg/dL — AB
Hgb urine dipstick: NEGATIVE
Ketones, ur: 5 mg/dL — AB
Leukocytes,Ua: NEGATIVE
Nitrite: NEGATIVE
Protein, ur: NEGATIVE mg/dL
Specific Gravity, Urine: >1.046 — ABNORMAL HIGH (ref 1.005–1.030)
pH: 5 (ref 5.0–8.0)

## 2020-08-19 LAB — CBC
HCT: 46 % (ref 39.0–52.0)
Hemoglobin: 16 g/dL (ref 13.0–17.0)
MCH: 31.1 pg (ref 26.0–34.0)
MCHC: 34.8 g/dL (ref 30.0–36.0)
MCV: 89.5 fL (ref 80.0–100.0)
Platelets: 247 10*3/uL (ref 150–400)
RBC: 5.14 MIL/uL (ref 4.22–5.81)
RDW: 12.5 % (ref 11.5–15.5)
WBC: 6.9 10*3/uL (ref 4.0–10.5)
nRBC: 0 % (ref 0.0–0.2)

## 2020-08-19 LAB — GLUCOSE, CAPILLARY
Glucose-Capillary: 134 mg/dL — ABNORMAL HIGH (ref 70–99)
Glucose-Capillary: 265 mg/dL — ABNORMAL HIGH (ref 70–99)

## 2020-08-19 LAB — LIPASE, BLOOD: Lipase: 162 U/L — ABNORMAL HIGH (ref 11–51)

## 2020-08-19 LAB — RESP PANEL BY RT-PCR (FLU A&B, COVID) ARPGX2
Influenza A by PCR: NEGATIVE
Influenza B by PCR: NEGATIVE
SARS Coronavirus 2 by RT PCR: NEGATIVE

## 2020-08-19 LAB — HEMOGLOBIN A1C
Hgb A1c MFr Bld: 9.7 % — ABNORMAL HIGH (ref 4.8–5.6)
Mean Plasma Glucose: 231.69 mg/dL

## 2020-08-19 LAB — HIV ANTIBODY (ROUTINE TESTING W REFLEX): HIV Screen 4th Generation wRfx: NONREACTIVE

## 2020-08-19 MED ORDER — MORPHINE SULFATE (PF) 4 MG/ML IV SOLN
4.0000 mg | Freq: Once | INTRAVENOUS | Status: AC
  Administered 2020-08-19: 4 mg via INTRAVENOUS
  Filled 2020-08-19: qty 1

## 2020-08-19 MED ORDER — DIPHENHYDRAMINE HCL 50 MG/ML IJ SOLN
25.0000 mg | Freq: Once | INTRAMUSCULAR | Status: AC
  Administered 2020-08-19: 25 mg via INTRAVENOUS
  Filled 2020-08-19: qty 1

## 2020-08-19 MED ORDER — HYDROMORPHONE HCL 1 MG/ML IJ SOLN
0.5000 mg | Freq: Once | INTRAMUSCULAR | Status: AC
  Administered 2020-08-19: 0.5 mg via INTRAVENOUS
  Filled 2020-08-19: qty 1

## 2020-08-19 MED ORDER — DULOXETINE HCL 60 MG PO CPEP
60.0000 mg | ORAL_CAPSULE | Freq: Every day | ORAL | Status: DC
  Administered 2020-08-19 – 2020-08-23 (×4): 60 mg via ORAL
  Filled 2020-08-19 (×4): qty 1

## 2020-08-19 MED ORDER — HYDRALAZINE HCL 20 MG/ML IJ SOLN
10.0000 mg | Freq: Four times a day (QID) | INTRAMUSCULAR | Status: DC | PRN
  Administered 2020-08-19: 10 mg via INTRAVENOUS
  Filled 2020-08-19: qty 1

## 2020-08-19 MED ORDER — ONDANSETRON HCL 4 MG/2ML IJ SOLN
4.0000 mg | Freq: Once | INTRAMUSCULAR | Status: AC
  Administered 2020-08-19: 4 mg via INTRAVENOUS
  Filled 2020-08-19: qty 2

## 2020-08-19 MED ORDER — BACLOFEN 10 MG PO TABS
5.0000 mg | ORAL_TABLET | Freq: Every evening | ORAL | Status: DC | PRN
  Filled 2020-08-19: qty 1

## 2020-08-19 MED ORDER — ONDANSETRON HCL 4 MG PO TABS: 4.0000 mg | ORAL_TABLET | Freq: Four times a day (QID) | ORAL | Status: DC | PRN

## 2020-08-19 MED ORDER — SODIUM CHLORIDE 0.9 % IV BOLUS
1000.0000 mL | Freq: Once | INTRAVENOUS | Status: AC
  Administered 2020-08-19: 1000 mL via INTRAVENOUS

## 2020-08-19 MED ORDER — METOCLOPRAMIDE HCL 5 MG/ML IJ SOLN
10.0000 mg | Freq: Once | INTRAMUSCULAR | Status: AC
  Administered 2020-08-19: 10 mg via INTRAVENOUS
  Filled 2020-08-19: qty 2

## 2020-08-19 MED ORDER — ONDANSETRON HCL 4 MG/2ML IJ SOLN
4.0000 mg | Freq: Four times a day (QID) | INTRAMUSCULAR | Status: DC | PRN
  Administered 2020-08-19 – 2020-08-23 (×7): 4 mg via INTRAVENOUS
  Filled 2020-08-19 (×7): qty 2

## 2020-08-19 MED ORDER — POTASSIUM CHLORIDE CRYS ER 20 MEQ PO TBCR
20.0000 meq | EXTENDED_RELEASE_TABLET | Freq: Every day | ORAL | Status: DC
  Administered 2020-08-19 – 2020-08-21 (×3): 20 meq via ORAL
  Filled 2020-08-19 (×3): qty 1

## 2020-08-19 MED ORDER — INSULIN ASPART 100 UNIT/ML IJ SOLN
0.0000 [IU] | INTRAMUSCULAR | Status: DC
  Administered 2020-08-19: 2 [IU] via SUBCUTANEOUS
  Administered 2020-08-19 – 2020-08-20 (×2): 8 [IU] via SUBCUTANEOUS
  Administered 2020-08-20 (×4): 3 [IU] via SUBCUTANEOUS
  Administered 2020-08-21 (×4): 2 [IU] via SUBCUTANEOUS
  Administered 2020-08-21: 3 [IU] via SUBCUTANEOUS
  Administered 2020-08-21: 2 [IU] via SUBCUTANEOUS
  Administered 2020-08-22: 5 [IU] via SUBCUTANEOUS
  Administered 2020-08-22: 2 [IU] via SUBCUTANEOUS
  Administered 2020-08-22: 3 [IU] via SUBCUTANEOUS
  Administered 2020-08-22 – 2020-08-23 (×3): 2 [IU] via SUBCUTANEOUS
  Administered 2020-08-23: 3 [IU] via SUBCUTANEOUS
  Filled 2020-08-19: qty 1
  Filled 2020-08-19: qty 0.15

## 2020-08-19 MED ORDER — MORPHINE SULFATE (PF) 4 MG/ML IV SOLN
4.0000 mg | Freq: Once | INTRAVENOUS | Status: AC
Start: 2020-08-19 — End: 2020-08-19
  Administered 2020-08-19: 4 mg via INTRAVENOUS
  Filled 2020-08-19: qty 1

## 2020-08-19 MED ORDER — PANTOPRAZOLE SODIUM 40 MG IV SOLR
40.0000 mg | Freq: Every day | INTRAVENOUS | Status: DC
  Administered 2020-08-19 – 2020-08-21 (×3): 40 mg via INTRAVENOUS
  Filled 2020-08-19 (×3): qty 40

## 2020-08-19 MED ORDER — IOHEXOL 300 MG/ML  SOLN
100.0000 mL | Freq: Once | INTRAMUSCULAR | Status: AC | PRN
  Administered 2020-08-19: 100 mL via INTRAVENOUS

## 2020-08-19 MED ORDER — SODIUM CHLORIDE 0.45 % IV SOLN
INTRAVENOUS | Status: DC
Start: 2020-08-19 — End: 2020-08-22

## 2020-08-19 MED ORDER — HYDROCHLOROTHIAZIDE 12.5 MG PO CAPS: 25.0000 mg | ORAL_CAPSULE | Freq: Every day | ORAL | Status: DC

## 2020-08-19 MED ORDER — MORPHINE SULFATE (PF) 2 MG/ML IV SOLN
2.0000 mg | INTRAVENOUS | Status: DC | PRN
  Administered 2020-08-19 – 2020-08-21 (×6): 2 mg via INTRAVENOUS
  Filled 2020-08-19 (×6): qty 1

## 2020-08-19 MED ORDER — ENOXAPARIN SODIUM 40 MG/0.4ML IJ SOSY
40.0000 mg | PREFILLED_SYRINGE | Freq: Every day | INTRAMUSCULAR | Status: DC
  Administered 2020-08-19 – 2020-08-23 (×4): 40 mg via SUBCUTANEOUS
  Filled 2020-08-19 (×5): qty 0.4

## 2020-08-19 MED ORDER — LOSARTAN POTASSIUM 50 MG PO TABS
50.0000 mg | ORAL_TABLET | Freq: Every day | ORAL | Status: DC
  Administered 2020-08-19 – 2020-08-23 (×4): 50 mg via ORAL
  Filled 2020-08-19 (×4): qty 1

## 2020-08-19 NOTE — Consult Note (Signed)
Berlinda Last 03-26-1971  [id number].    Requesting MD: Dr. Lucile Shutters Chief Complaint/Reason for Consult: gallstone pancreatitis  HPI:  This is a 50 year old male with a history of diabetes mellitus and hypertension who has presented to the emergency department 3 times in 2021 secondary to epigastric abdominal pain along with nausea and vomiting.  At one point he was informed he had pancreatitis.  It appears he was never admitted to the hospital.  He has been told to stop drinking alcohol although it does not appear that he was ever a heavy drinker.  This past Friday night he began having similar symptoms again including epigastric pain along with nausea and vomiting.  He has not been able to eat since Thursday night.  He is unaware whether food brings this on or not.  He admits to some chills but denies any fever or diarrhea.  He denies any chest pain, shortness of breath, weight loss.  He does state that he did not void at all over the weekend.  He just voided in the emergency department and states this was the first time he has voided since Friday.  He presented to the [facility] emergency department today for evaluation due to persistent nausea and vomiting.  CT scan of his abdomen was unremarkable.  He had an abdominal ultrasound which revealed cholelithiasis but no evidence for cholecystitis.  His white blood cell count is normal.  His liver function tests are also normal.  His lipase is mildly elevated in the 160's.  He is also noted to have a urine glucose greater than 500 and a blood glucose of around 430.  He admits to some blurry vision as his blood sugars are normally controlled with metformin in the 100s.  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  We have been asked to evaluate patient for possible gallstone pancreatitis.  ROS: ROS: Please see HPI, otherwise all other systems have been reviewed and are negative.  Of note, he recently  moved from [facility] to [place] and has not been able to find a primary care physician since then.  Family History  Problem Relation Age of Onset  . Hypertension Mother   . Diabetes Mother   . Pancreatitis Father   . Cancer Father   . Hypertension Father   . Diabetes Father     Past Medical History:  Diagnosis Date  . Diabetes mellitus without complication (HCC)   . Hypertension   . Pancreatitis     Past Surgical History:  Procedure Laterality Date  . HERNIA REPAIR      Social History:  reports that he has never smoked. He has never used smokeless tobacco. He reports previous alcohol use. He reports previous drug use.  Allergies:  Allergies  Allergen Reactions  . Penicillins Hives    (Not in a hospital admission)    Physical Exam: Blood pressure 135/79, pulse 74, temperature 97.7 F (36.5 C), temperature source Oral, resp. rate 16, height 6\' 2"  (1.88 m), weight 97.5 kg, SpO2 98 %. General: pleasant, WD, WN black male who is laying in bed in NAD HEENT: head is normocephalic, atraumatic.  Sclera are noninjected.  PERRL.  Ears and nose without any masses or lesions.  Mouth is pink and moist Heart: regular, rate, and rhythm.  Normal s1,s2. No obvious murmurs, gallops, or rubs noted.  Palpable radial and pedal pulses bilaterally Lungs: CTAB, no wheezes, rhonchi, or rales noted.  Respiratory effort nonlabored  Abd: soft, mild diffuse abdominal tenderness greatest in epigastrium and right upper quadrant, ND, hypoactive BS, no masses, hernias, or organomegaly MS: all 4 extremities are symmetrical with no cyanosis, clubbing, or edema. Skin: warm and dry with no masses, lesions, or rashes Neuro: Cranial nerves 2-12 grossly intact, sensation is normal throughout Psych: A&Ox3 with an appropriate affect.   Results for orders placed or performed during the hospital encounter of 08/19/20 (from the past 48 hour(s))  Lipase, blood     Status: Abnormal   Collection Time:  08/19/20 11:30 AM  Result Value Ref Range   Lipase 162 (H) 11 - 51 U/L    Comment: Performed at [facility], [address], [place], [place] [address]  Comprehensive metabolic panel     Status: Abnormal   Collection Time: 08/19/20 11:30 AM  Result Value Ref Range   Sodium 132 (L) 135 - 145 mmol/L   Potassium 4.1 3.5 - 5.1 mmol/L   Chloride 93 (L) 98 - 111 mmol/L   CO2 23 22 - 32 mmol/L   Glucose, Bld 430 (H) 70 - 99 mg/dL    Comment: Glucose reference range applies only to samples taken after fasting for at least 8 hours.   BUN 27 (H) 6 - 20 mg/dL   Creatinine, Ser 0.73 0.61 - 1.24 mg/dL   Calcium 71.0 8.9 - 62.6 mg/dL   Total Protein 9.0 (H) 6.5 - 8.1 g/dL   Albumin 5.2 (H) 3.5 - 5.0 g/dL   AST 21 15 - 41 U/L   ALT 26 0 - 44 U/L   Alkaline Phosphatase 81 38 - 126 U/L   Total Bilirubin 1.2 0.3 - 1.2 mg/dL   GFR, Estimated >94 >85 mL/min    Comment: (NOTE) Calculated using the CKD-EPI Creatinine Equation (2021)    Anion gap 16 (H) 5 - 15    Comment: Performed at [facility], [address] [place], [place] [address]  CBC     Status: None   Collection Time: 08/19/20 11:30 AM  Result Value Ref Range   WBC 6.9 4.0 - 10.5 K/uL   RBC 5.14 4.22 - 5.81 MIL/uL   Hemoglobin 16.0 13.0 - 17.0 g/dL   HCT 35.0 09.3 - 81.8 %   MCV 89.5 80.0 - 100.0 fL   MCH 31.1 26.0 - 34.0 pg   MCHC 34.8 30.0 - 36.0 g/dL   RDW 29.9 37.1 - 69.6 %   Platelets 247 150 - 400 K/uL   nRBC 0.0 0.0 - 0.2 %    Comment: Performed at [facility], [address] [place], [place] [address]  Urinalysis, Routine w reflex microscopic Urine, Clean Catch     Status: Abnormal   Collection Time: 08/19/20  3:07 PM  Result Value Ref Range   Color, Urine YELLOW YELLOW   APPearance CLEAR CLEAR   Specific Gravity, Urine >1.046 (H) 1.005 - 1.030   pH 5.0 5.0 - 8.0   Glucose, UA >=500 (A) NEGATIVE mg/dL   Hgb urine dipstick NEGATIVE NEGATIVE   Bilirubin Urine  NEGATIVE NEGATIVE   Ketones, ur 5 (A) NEGATIVE mg/dL   Protein, ur NEGATIVE NEGATIVE mg/dL   Nitrite NEGATIVE NEGATIVE   Leukocytes,Ua NEGATIVE NEGATIVE   RBC / HPF 0-5 0 - 5 RBC/hpf   WBC, UA 0-5 0 - 5 WBC/hpf   Bacteria, UA NONE SEEN NONE SEEN    Comment: Performed at [facility], [address], [place], [place] [address]  CT Abdomen Pelvis W Contrast  Result Date: 08/19/2020 CLINICAL DATA:  Right lower quadrant abdominal pain. Elevated serum lipase. EXAM: CT ABDOMEN AND PELVIS WITH CONTRAST TECHNIQUE: Multidetector CT imaging of the abdomen and pelvis was performed using the standard protocol following bolus administration of intravenous contrast. CONTRAST:  OMNIPAQUE IOHEXOL 300 MG/ML  SOLN COMPARISON:  CT 01/25/2020 FINDINGS: Lower chest: Included lung bases are clear.  Heart size is normal. Hepatobiliary: Mildly decreased attenuation of the hepatic parenchyma. No focal liver lesion. Gallbladder appears unremarkable. No hyperdense gallstone. No biliary dilatation. Pancreas: Unremarkable. No pancreatic ductal dilatation or surrounding inflammatory changes. Pancreatic parenchyma enhances homogeneously. No adjacent fluid or fluid collections. Spleen: Normal in size without focal abnormality. Adrenals/Urinary Tract: Unremarkable adrenal glands. Stable 12 mm right renal cyst. Kidneys have otherwise symmetric enhancement. No renal stone or hydronephrosis. Ureters and urinary bladder within normal limits. Stomach/Bowel: Stomach is within normal limits. Appendix appears normal (series 2, image 61). No evidence of bowel wall thickening, distention, or inflammatory changes. Vascular/Lymphatic: Minimal scattered aortoiliac atherosclerotic calcifications without aneurysm. No abdominopelvic lymphadenopathy. Reproductive: Prostate is unremarkable. Other: No free fluid. No abdominopelvic fluid collection. No pneumoperitoneum. No abdominal wall hernia. Musculoskeletal: No acute osseous  findings. Mild degenerative changes of the bilateral hips. No suspicious bone lesions. IMPRESSION: 1. No acute findings in the abdomen or pelvis. Specifically, no CT evidence of acute pancreatitis or appendicitis. 2. Mild hepatic steatosis. 3. Aortic atherosclerosis (ICD10-I70.0). Electronically Signed   By: Duanne Guess D.O.   On: 08/19/2020 14:02   US Abdomen Limited RUQ (LIVER/GB)  Result Date: 08/19/2020 CLINICAL DATA:  Right-sided abdominal pain EXAM: ULTRASOUND ABDOMEN LIMITED RIGHT UPPER QUADRANT COMPARISON:  CT from earlier in the same day. FINDINGS: Gallbladder: Well distended with multiple gallstones within. Positive sonographic Murphy's sign is noted. No wall thickening or pericholecystic fluid is noted. Common bile duct: Diameter: 3 mm. Liver: Increased echogenicity is noted consistent with fatty infiltration. Portal vein is patent on color Doppler imaging with normal direction of blood flow towards the liver. Other: None. IMPRESSION: Cholelithiasis with positive sonographic Murphy's sign. No wall thickening or pericholecystic fluid is noted. Fatty liver. Electronically Signed   By: Alcide Clever M.D.   On: 08/19/2020 15:29      Assessment/Plan Diabetes mellitus with uncontrolled blood glucose over 400 Hypertension  Gallstone pancreatitis The patient has a mildly elevated lipase in the 160's.  It is possible given his symptoms have been present since Friday that this was higher and has been trending down.  He is still quite tender in his epigastrium and right upper quadrant.  He does not have signs of acute cholecystitis so this tenderness is concerning for continued pancreatitis.  We would recommend n.p.o. and IV fluids for his pancreatitis.  This will need to resolve prior to any surgical intervention.  We will follow the patient closely to determine the safest timeframe for surgery.  We agree with medical admission given his pancreatitis as well as his uncontrolled diabetes.   FEN  -NPO/IV fluids VTE -okay for chemical prophylaxis from surgical standpoint ID -none currently needed   Letha Cape, [facility] Surgery 08/19/2020, 4:32 PM Please see Amion for pager number during day hours 7:00am-4:30pm or 7:00am -11:30am on weekends

## 2020-08-19 NOTE — ED Notes (Signed)
US at bedside

## 2020-08-19 NOTE — ED Provider Notes (Signed)
[facility]-EMERGENCY DEPT Provider Note   CSN: [id number] Arrival date & time: 08/19/20  1009     History Chief Complaint  Patient presents with  . Emesis  . Abdominal Pain    Edward Wise is a 50 y.o. male presenting for evaluation of nausea, vomiting, abdominal pain.  Patient states the past 2 days, he has had gradually worsening symptoms.  He is having mostly pain in his epigastric and right upper quadrant abdomen.  He reports history of similar when he has had issues with pancreatitis.  He has a history of diabetes.  He denies alcohol use currently, stopped after his first pancreatis episode.  He still has his gallbladder.  He denies sick contacts.  He denies fevers, chills, chest pain, shortness of breath.  He reports decreased urination and no bowel movement for the past several days, which is atypical for him.  Additional history and chart review.  History of diabetes, hypertension, pancreatitis  HPI     Past Medical History:  Diagnosis Date  . Diabetes mellitus without complication (HCC)   . Hypertension   . Pancreatitis     Patient Active Problem List   Diagnosis Date Noted  . Acute gallstone pancreatitis 08/19/2020  . Diabetes mellitus with hyperglycemia (HCC) 08/19/2020  . Hypertension     Past Surgical History:  Procedure Laterality Date  . HERNIA REPAIR         Family History  Problem Relation Age of Onset  . Hypertension Mother   . Diabetes Mother   . Pancreatitis Father   . Cancer Father   . Hypertension Father   . Diabetes Father     Social History   Tobacco Use  . Smoking status: Never Smoker  . Smokeless tobacco: Never Used  Vaping Use  . Vaping Use: Never used  Substance Use Topics  . Alcohol use: Not Currently  . Drug use: Not Currently    Home Medications Prior to Admission medications   Medication Sig Start Date End Date Taking? Authorizing Provider  baclofen (LIORESAL) 10 MG tablet TAKE 1/2 TO 1 TABLET  BY MOUTH AT BEDTIME AS NEEDED FOR MUSCLE SPASMS. 07/29/20   Hilts, Casimiro Needle, MD  cyclobenzaprine (FLEXERIL) 10 MG tablet Take 1 tablet (10 mg total) by mouth 2 (two) times daily as needed for muscle spasms. Patient not taking: Reported on 03/04/2020 10/11/19   Farrel Gordon, PA-C  DULoxetine (CYMBALTA) 60 MG capsule Take 60 mg by mouth daily. 01/18/20   [provider]  glimepiride (AMARYL) 2 MG tablet Take by mouth. 02/17/19   [provider]  hydrochlorothiazide (HYDRODIURIL) 25 MG tablet Take 25 mg by mouth daily. 01/27/20   [provider]  losartan (COZAAR) 50 MG tablet Take 50 mg by mouth daily. 12/30/19   [provider]  metFORMIN (GLUCOPHAGE) 1000 MG tablet Take by mouth. 02/17/19   [provider]  naproxen (NAPROSYN) 375 MG tablet Take 1 tablet (375 mg total) by mouth 2 (two) times daily. Patient not taking: Reported on 03/04/2020 10/11/19   Farrel Gordon, PA-C  ondansetron (ZOFRAN ODT) 4 MG disintegrating tablet Take 1 tablet (4 mg total) by mouth every 8 (eight) hours as needed for nausea or vomiting. 02/15/20   Petrucelli, Samantha R, PA-C  pantoprazole (PROTONIX) 40 MG tablet Take 1 tablet (40 mg total) by mouth daily. 02/15/20   Petrucelli, Samantha R, PA-C  potassium chloride SA (KLOR-CON) 20 MEQ tablet Take 1 tablet (20 mEq total) by mouth daily. 02/15/20  Petrucelli, Samantha R, PA-C  sucralfate (CARAFATE) 1 GM/10ML suspension Take 10 mLs (1 g total) by mouth 4 (four) times daily -  with meals and at bedtime. 02/15/20   Petrucelli, Pleas KochSamantha R, PA-C    Allergies    Penicillins  Review of Systems   Review of Systems  Gastrointestinal: Positive for abdominal pain, constipation, nausea and vomiting.  Genitourinary: Positive for decreased urine volume.  All other systems reviewed and are negative.   Physical Exam Updated Vital Signs BP 135/79   Pulse 74   Temp 97.7 F (36.5 C) (Oral)   Resp 16   Ht 6\' 2"  (1.88 m)   Wt 97.5 kg   SpO2  98%   BMI 27.60 kg/m   Physical Exam Vitals and nursing note reviewed.  Constitutional:      General: He is not in acute distress.    Appearance: He is well-developed.     Comments: Appears nontoxic, uncomfortable due to pain  HENT:     Head: Normocephalic and atraumatic.  Eyes:     Conjunctiva/sclera: Conjunctivae normal.     Pupils: Pupils are equal, round, and reactive to light.  Cardiovascular:     Rate and Rhythm: Normal rate and regular rhythm.     Pulses: Normal pulses.  Pulmonary:     Effort: Pulmonary effort is normal. No respiratory distress.     Breath sounds: Normal breath sounds. No wheezing.  Abdominal:     General: There is no distension.     Palpations: Abdomen is soft. There is no mass.     Tenderness: There is abdominal tenderness in the right upper quadrant and epigastric area. There is no guarding or rebound. Positive signs include Murphy's sign.     Comments: Tenderness palpation of mid, epigastric, and right upper quadrant abdomen.  Positive Murphy's.  No rigidity or distention.  Negative CVA  Musculoskeletal:        General: Normal range of motion.     Cervical back: Normal range of motion and neck supple.  Skin:    General: Skin is warm and dry.     Capillary Refill: Capillary refill takes less than 2 seconds.  Neurological:     Mental Status: He is alert and oriented to person, place, and time.     ED Results / Procedures / Treatments   Labs (all labs ordered are listed, but only abnormal results are displayed) Labs Reviewed  LIPASE, BLOOD - Abnormal; Notable for the following components:      Result Value   Lipase 162 (*)    All other components within normal limits  COMPREHENSIVE METABOLIC PANEL - Abnormal; Notable for the following components:   Sodium 132 (*)    Chloride 93 (*)    Glucose, Bld 430 (*)    BUN 27 (*)    Total Protein 9.0 (*)    Albumin 5.2 (*)    Anion gap 16 (*)    All other components within normal limits  URINALYSIS,  ROUTINE W REFLEX MICROSCOPIC - Abnormal; Notable for the following components:   Specific Gravity, Urine >1.046 (*)    Glucose, UA >=500 (*)    Ketones, ur 5 (*)    All other components within normal limits  RESP PANEL BY RT-PCR (FLU A&B, COVID) ARPGX2  CBC  HIV ANTIBODY (ROUTINE TESTING W REFLEX)  HEMOGLOBIN A1C    EKG None  Radiology CT Abdomen Pelvis W Contrast  Result Date: 08/19/2020 CLINICAL DATA:  Right lower quadrant abdominal pain.  Elevated serum lipase. EXAM: CT ABDOMEN AND PELVIS WITH CONTRAST TECHNIQUE: Multidetector CT imaging of the abdomen and pelvis was performed using the standard protocol following bolus administration of intravenous contrast. CONTRAST:  OMNIPAQUE IOHEXOL 300 MG/ML  SOLN COMPARISON:  CT 01/25/2020 FINDINGS: Lower chest: Included lung bases are clear.  Heart size is normal. Hepatobiliary: Mildly decreased attenuation of the hepatic parenchyma. No focal liver lesion. Gallbladder appears unremarkable. No hyperdense gallstone. No biliary dilatation. Pancreas: Unremarkable. No pancreatic ductal dilatation or surrounding inflammatory changes. Pancreatic parenchyma enhances homogeneously. No adjacent fluid or fluid collections. Spleen: Normal in size without focal abnormality. Adrenals/Urinary Tract: Unremarkable adrenal glands. Stable 12 mm right renal cyst. Kidneys have otherwise symmetric enhancement. No renal stone or hydronephrosis. Ureters and urinary bladder within normal limits. Stomach/Bowel: Stomach is within normal limits. Appendix appears normal (series 2, image 61). No evidence of bowel wall thickening, distention, or inflammatory changes. Vascular/Lymphatic: Minimal scattered aortoiliac atherosclerotic calcifications without aneurysm. No abdominopelvic lymphadenopathy. Reproductive: Prostate is unremarkable. Other: No free fluid. No abdominopelvic fluid collection. No pneumoperitoneum. No abdominal wall hernia. Musculoskeletal: No acute osseous  findings. Mild degenerative changes of the bilateral hips. No suspicious bone lesions. IMPRESSION: 1. No acute findings in the abdomen or pelvis. Specifically, no CT evidence of acute pancreatitis or appendicitis. 2. Mild hepatic steatosis. 3. Aortic atherosclerosis (ICD10-I70.0). Electronically Signed   By: Duanne Guess D.O.   On: 08/19/2020 14:02   US Abdomen Limited RUQ (LIVER/GB)  Result Date: 08/19/2020 CLINICAL DATA:  Right-sided abdominal pain EXAM: ULTRASOUND ABDOMEN LIMITED RIGHT UPPER QUADRANT COMPARISON:  CT from earlier in the same day. FINDINGS: Gallbladder: Well distended with multiple gallstones within. Positive sonographic Murphy's sign is noted. No wall thickening or pericholecystic fluid is noted. Common bile duct: Diameter: 3 mm. Liver: Increased echogenicity is noted consistent with fatty infiltration. Portal vein is patent on color Doppler imaging with normal direction of blood flow towards the liver. Other: None. IMPRESSION: Cholelithiasis with positive sonographic Murphy's sign. No wall thickening or pericholecystic fluid is noted. Fatty liver. Electronically Signed   By: Alcide Clever M.D.   On: 08/19/2020 15:29    Procedures Procedures   Medications Ordered in ED Medications  ondansetron (ZOFRAN) injection 4 mg (has no administration in time range)  morphine 4 MG/ML injection 4 mg (has no administration in time range)  hydrochlorothiazide (HYDRODIURIL) tablet 25 mg (has no administration in time range)  losartan (COZAAR) tablet 50 mg (has no administration in time range)  DULoxetine (CYMBALTA) DR capsule 60 mg (has no administration in time range)  baclofen (LIORESAL) tablet 5 mg (has no administration in time range)  potassium chloride SA (KLOR-CON) CR tablet 20 mEq (has no administration in time range)  enoxaparin (LOVENOX) injection 40 mg (has no administration in time range)  0.45 % sodium chloride infusion (has no administration in time range)  morphine 2 MG/ML  injection 2 mg (has no administration in time range)  ondansetron (ZOFRAN) tablet 4 mg (has no administration in time range)    Or  ondansetron (ZOFRAN) injection 4 mg (has no administration in time range)  pantoprazole (PROTONIX) injection 40 mg (has no administration in time range)  insulin aspart (novoLOG) injection 0-15 Units (has no administration in time range)  ondansetron (ZOFRAN) injection 4 mg (4 mg Intravenous Given 08/19/20 1223)  sodium chloride 0.9 % bolus 1,000 mL (0 mLs Intravenous Stopped 08/19/20 1507)  morphine 4 MG/ML injection 4 mg (4 mg Intravenous Given 08/19/20 1223)  metoCLOPramide (REGLAN) injection 10 mg (  10 mg Intravenous Given 08/19/20 1255)  diphenhydrAMINE (BENADRYL) injection 25 mg (25 mg Intravenous Given 08/19/20 1254)  HYDROmorphone (DILAUDID) injection 0.5 mg (0.5 mg Intravenous Given 08/19/20 1255)  iohexol (OMNIPAQUE) 300 MG/ML solution 100 mL (100 mLs Intravenous Contrast Given 08/19/20 1335)    ED Course  I have reviewed the triage vital signs and the nursing notes.  Pertinent labs & imaging results that were available during my care of the patient were reviewed by me and considered in my medical decision making (see chart for details).    MDM Rules/Calculators/A&P                          Patient resenting for evaluation nausea, vomiting, Donnell pain.  Also having decreased urination and constipation.  On exam, patient appears nontoxic, though comfortable due to pain.  He does have tenderness palpation of the abdomen.  Consider pancreatitis versus gallbladder versus appendicitis or small bowel obstruction.  Labs obtained from triage show elevated lipase, most consistent with pancreatitis.  LFTs are normal, less likely gallbladder etiology.  CT pending.  After first round of medication, patient reports he continues to have vomiting and severe pain, will give another round of pain and nausea control.  CT abdomen pelvis negative for acute findings.  On reassessment,  patient reports pain and nausea is improved.  However he continues to have significant epigastric and right upper quadrant tenderness.  Will obtain ultrasound to assess gallbladder more completely.  Ultrasound shows cholelithiasis with positive Murphy sign.  In the setting of elevated lipase, cholelithiasis, and continued pain, will consult general surgery.  Discussed with general surgery PA, who recommends admission to medicine for blood sugar control, they will evaluate patient while he is hospitalized.  Discussed with Dr. Joylene Igo from [facility] hospitalist service, patient to be admitted  Final Clinical Impression(s) / ED Diagnoses Final diagnoses:  Upper abdominal pain  Calculus of gallbladder without cholecystitis without obstruction  Elevated lipase  Non-intractable vomiting with nausea, unspecified vomiting type  Hyperglycemia    Rx / DC Orders ED Discharge Orders    None       Alveria Apley, PA-C 08/19/20 1649    Wynetta Fines, MD 08/21/20 1620

## 2020-08-19 NOTE — H&P (Signed)
History and Physical    Edward Wise WNI:[id number] DOB: 1971-02-21 DOA: 08/19/2020  PCP: Pcp, No   Patient coming from: Home  I have personally briefly reviewed patient's old medical records in [facility]  Chief Complaint: Abdominal pain  HPI: Edward Wise is a 50 y.o. male with medical history significant for type 2 diabetes mellitus and hypertension who presents to the ER for evaluation of abdominal pain which he has had for 3 days.  Pain is mostly in the epigastrium/periumbilical area as well as right upper quadrant and has been constant for the last 3 days.  He rated his pain a 7 x 10 in intensity at its worst associated with nausea and multiple episodes of emesis. Patient states that he has had a similar episode in the past and had taken Zofran at home to help with his symptoms without any significant improvement.  He has been unable to tolerate any oral intake and was also unable to take his medications as well. He denies having any fever or chills, no cough, no chest pain, no shortness of breath, no dizziness, no lightheadedness, no changes in his bowel habits, no urinary symptoms, no weakness, no headache, no cough He denies any alcohol ingestion or NSAID use. Labs show sodium 132, potassium 4.1, chloride 93, bicarb 23, glucose 430, BUN 27, creatinine 1.23, calcium 10.0, alkaline phosphatase 81, albumin 5.2, lipase 162, AST 21, ALT 26, total protein 9.0, white count 6.9, hemoglobin 16.0, hematocrit 46, MCV 89.5, RDW 12.5, platelet count 247 CT scan of abdomen and pelvis shows no acute findings in the abdomen or pelvis. Specifically, no CT evidence of acute pancreatitis or appendicitis.Mild hepatic steatosis. Aortic atherosclerosis  Gallbladder ultrasound showed cholelithiasis with positive sonographic Murphy's sign. No wall thickening or pericholecystic fluid is noted. Fatty liver.    ED Course: Patient is a 50 year old African-American male with a history of diabetes mellitus  and hypertension who presents to the ER for evaluation of a 3-day history of abdominal pain mostly in the epigastrium/periumbilical and right upper quadrant associated with nausea and vomiting.  Labs reveal elevated lipase levels as well as hyperglycemia.  Gallbladder ultrasound shows cholelithiasis.  Patient will be admitted to the hospital for further evaluation.  Review of Systems: As per HPI otherwise all other systems reviewed and negative.   Past Medical History:  Diagnosis Date  . Diabetes mellitus without complication (HCC)   . Hypertension   . Pancreatitis     Past Surgical History:  Procedure Laterality Date  . HERNIA REPAIR       reports that he has never smoked. He has never used smokeless tobacco. He reports previous alcohol use. He reports previous drug use.  Allergies  Allergen Reactions  . Penicillins Hives    Family History  Problem Relation Age of Onset  . Hypertension Mother   . Diabetes Mother   . Pancreatitis Father   . Cancer Father   . Hypertension Father   . Diabetes Father       Prior to Admission medications   Medication Sig Start Date End Date Taking? Authorizing Provider  baclofen (LIORESAL) 10 MG tablet TAKE 1/2 TO 1 TABLET BY MOUTH AT BEDTIME AS NEEDED FOR MUSCLE SPASMS. 07/29/20   Hilts, Casimiro Needle, MD  cyclobenzaprine (FLEXERIL) 10 MG tablet Take 1 tablet (10 mg total) by mouth 2 (two) times daily as needed for muscle spasms. Patient not taking: Reported on 03/04/2020 10/11/19   Farrel Gordon, PA-C  DULoxetine (CYMBALTA) 60 MG capsule Take 60  mg by mouth daily. 01/18/20   [provider]  glimepiride (AMARYL) 2 MG tablet Take by mouth. 02/17/19   [provider]  hydrochlorothiazide (HYDRODIURIL) 25 MG tablet Take 25 mg by mouth daily. 01/27/20   [provider]  losartan (COZAAR) 50 MG tablet Take 50 mg by mouth daily. 12/30/19   [provider]  metFORMIN (GLUCOPHAGE) 1000 MG tablet Take by mouth. 02/17/19    [provider]  naproxen (NAPROSYN) 375 MG tablet Take 1 tablet (375 mg total) by mouth 2 (two) times daily. Patient not taking: Reported on 03/04/2020 10/11/19   Farrel Gordon, PA-C  ondansetron (ZOFRAN ODT) 4 MG disintegrating tablet Take 1 tablet (4 mg total) by mouth every 8 (eight) hours as needed for nausea or vomiting. 02/15/20   Petrucelli, Samantha R, PA-C  pantoprazole (PROTONIX) 40 MG tablet Take 1 tablet (40 mg total) by mouth daily. 02/15/20   Petrucelli, Samantha R, PA-C  potassium chloride SA (KLOR-CON) 20 MEQ tablet Take 1 tablet (20 mEq total) by mouth daily. 02/15/20   Petrucelli, Samantha R, PA-C  sucralfate (CARAFATE) 1 GM/10ML suspension Take 10 mLs (1 g total) by mouth 4 (four) times daily -  with meals and at bedtime. 02/15/20   Cherly Anderson, PA-C    Physical Exam: Vitals:   08/19/20 1017 08/19/20 1020 08/19/20 1207 08/19/20 1509  BP: (!) 158/85  (!) 161/111 135/79  Pulse: 81  91 74  Resp: 14  20 16   Temp: 97.7 F (36.5 C)     TempSrc: Oral     SpO2: 97%  98% 98%  Weight:  97.5 kg    Height:  6\' 2"  (1.88 m)       Vitals:   08/19/20 1017 08/19/20 1020 08/19/20 1207 08/19/20 1509  BP: (!) 158/85  (!) 161/111 135/79  Pulse: 81  91 74  Resp: 14  20 16   Temp: 97.7 F (36.5 C)     TempSrc: Oral     SpO2: 97%  98% 98%  Weight:  97.5 kg    Height:  6\' 2"  (1.88 m)        Constitutional: Alert and oriented x 3 . Not in any apparent distress HEENT:      Head: Normocephalic and atraumatic.         Eyes: PERLA, EOMI, Conjunctivae are normal. Sclera is non-icteric.       Mouth/Throat: Mucous membranes are dry.       Neck: Supple with no signs of meningismus. Cardiovascular: Regular rate and rhythm. No murmurs, gallops, or rubs. 2+ symmetrical distal pulses are present . No JVD. No LE edema Respiratory: Respiratory effort normal .Lungs sounds clear bilaterally. No wheezes, crackles, or rhonchi.  Gastrointestinal: Soft,  Tender  epigastrium/periumbilical and right upper quadrant,  non distended with positive bowel sounds.  Genitourinary: No CVA tenderness. Musculoskeletal: Nontender with normal range of motion in all extremities. No cyanosis, or erythema of extremities. Neurologic:  Face is symmetric. Moving all extremities. No gross focal neurologic deficits  Skin: Skin is warm, dry.  No rash or ulcers Psychiatric: Mood and affect are normal   Labs on Admission: I have personally reviewed following labs and imaging studies  CBC: Recent Labs  Lab 08/19/20 1130  WBC 6.9  HGB 16.0  HCT 46.0  MCV 89.5  PLT 247   Basic Metabolic Panel: Recent Labs  Lab 08/19/20 1130  NA 132*  K 4.1  CL 93*  CO2 23  GLUCOSE 430*  BUN  27*  CREATININE 1.23  CALCIUM 10.0   GFR: Estimated Creatinine Clearance: 84.5 mL/min (by C-G formula based on SCr of 1.23 mg/dL). Liver Function Tests: Recent Labs  Lab 08/19/20 1130  AST 21  ALT 26  ALKPHOS 81  BILITOT 1.2  PROT 9.0*  ALBUMIN 5.2*   Recent Labs  Lab 08/19/20 1130  LIPASE 162*   No results for input(s): AMMONIA in the last 168 hours. Coagulation Profile: No results for input(s): INR, PROTIME in the last 168 hours. Cardiac Enzymes: No results for input(s): CKTOTAL, CKMB, CKMBINDEX, TROPONINI in the last 168 hours. BNP (last 3 results) No results for input(s): PROBNP in the last 8760 hours. HbA1C: No results for input(s): HGBA1C in the last 72 hours. CBG: No results for input(s): GLUCAP in the last 168 hours. Lipid Profile: No results for input(s): CHOL, HDL, LDLCALC, TRIG, CHOLHDL, LDLDIRECT in the last 72 hours. Thyroid Function Tests: No results for input(s): TSH, T4TOTAL, FREET4, T3FREE, THYROIDAB in the last 72 hours. Anemia Panel: No results for input(s): VITAMINB12, FOLATE, FERRITIN, TIBC, IRON, RETICCTPCT in the last 72 hours. Urine analysis:    Component Value Date/Time   COLORURINE YELLOW 08/19/2020 1507   APPEARANCEUR CLEAR 08/19/2020  1507   LABSPEC >1.046 (H) 08/19/2020 1507   PHURINE 5.0 08/19/2020 1507   GLUCOSEU >=500 (A) 08/19/2020 1507   HGBUR NEGATIVE 08/19/2020 1507   BILIRUBINUR NEGATIVE 08/19/2020 1507   KETONESUR 5 (A) 08/19/2020 1507   PROTEINUR NEGATIVE 08/19/2020 1507   NITRITE NEGATIVE 08/19/2020 1507   LEUKOCYTESUR NEGATIVE 08/19/2020 1507    Radiological Exams on Admission: CT Abdomen Pelvis W Contrast  Result Date: 08/19/2020 CLINICAL DATA:  Right lower quadrant abdominal pain. Elevated serum lipase. EXAM: CT ABDOMEN AND PELVIS WITH CONTRAST TECHNIQUE: Multidetector CT imaging of the abdomen and pelvis was performed using the standard protocol following bolus administration of intravenous contrast. CONTRAST:  OMNIPAQUE IOHEXOL 300 MG/ML  SOLN COMPARISON:  CT 01/25/2020 FINDINGS: Lower chest: Included lung bases are clear.  Heart size is normal. Hepatobiliary: Mildly decreased attenuation of the hepatic parenchyma. No focal liver lesion. Gallbladder appears unremarkable. No hyperdense gallstone. No biliary dilatation. Pancreas: Unremarkable. No pancreatic ductal dilatation or surrounding inflammatory changes. Pancreatic parenchyma enhances homogeneously. No adjacent fluid or fluid collections. Spleen: Normal in size without focal abnormality. Adrenals/Urinary Tract: Unremarkable adrenal glands. Stable 12 mm right renal cyst. Kidneys have otherwise symmetric enhancement. No renal stone or hydronephrosis. Ureters and urinary bladder within normal limits. Stomach/Bowel: Stomach is within normal limits. Appendix appears normal (series 2, image 61). No evidence of bowel wall thickening, distention, or inflammatory changes. Vascular/Lymphatic: Minimal scattered aortoiliac atherosclerotic calcifications without aneurysm. No abdominopelvic lymphadenopathy. Reproductive: Prostate is unremarkable. Other: No free fluid. No abdominopelvic fluid collection. No pneumoperitoneum. No abdominal wall hernia. Musculoskeletal: No  acute osseous findings. Mild degenerative changes of the bilateral hips. No suspicious bone lesions. IMPRESSION: 1. No acute findings in the abdomen or pelvis. Specifically, no CT evidence of acute pancreatitis or appendicitis. 2. Mild hepatic steatosis. 3. Aortic atherosclerosis (ICD10-I70.0). Electronically Signed   By: Duanne Guess D.O.   On: 08/19/2020 14:02   US Abdomen Limited RUQ (LIVER/GB)  Result Date: 08/19/2020 CLINICAL DATA:  Right-sided abdominal pain EXAM: ULTRASOUND ABDOMEN LIMITED RIGHT UPPER QUADRANT COMPARISON:  CT from earlier in the same day. FINDINGS: Gallbladder: Well distended with multiple gallstones within. Positive sonographic Murphy's sign is noted. No wall thickening or pericholecystic fluid is noted. Common bile duct: Diameter: 3 mm. Liver: Increased echogenicity is noted  consistent with fatty infiltration. Portal vein is patent on color Doppler imaging with normal direction of blood flow towards the liver. Other: None. IMPRESSION: Cholelithiasis with positive sonographic Murphy's sign. No wall thickening or pericholecystic fluid is noted. Fatty liver. Electronically Signed   By: Alcide CleverMark  Lukens M.D.   On: 08/19/2020 15:29     Assessment/Plan Principal Problem:   Acute gallstone pancreatitis Active Problems:   Diabetes mellitus with hyperglycemia (HCC)   Hypertension     Acute gallstone pancreatitis Patient with a known history of cholelithiasis who presents to the ER for evaluation of abdominal pain mostly in the epigastrium/periumbilical area/right upper quadrant associated elevated lipase levels, nausea and vomiting. We will keep patient n.p.o. Pain control Supportive care with IV fluid hydration and antiemetics    Diabetes mellitus with hyperglycemia Aggressive IV fluid hydration Place patient on sliding scale coverage with insulin Accu-Cheks every 4 hours    Hypertension Uncontrolled Continue Cozaar Hold hydrochlorothiazide for now As needed IV  hydralazine for systolic blood pressure greater than 160mmHg  DVT prophylaxis: Lovenox Code Status: full code Family Communication: Greater than 50% of time was spent discussing patient's condition and plan of care with him at the bedside.  All questions and concerns have been addressed.  He verbalizes understanding and agrees with the plan. Disposition Plan: Back to previous home environment Consults called: Surgery Status: At the time of admission, it appears that the appropriate admission status for this patient is inpatient. This is judged to be reasonable and necessary in order to provide the required intensity of service to ensure the patient's safety given the presenting symptoms, physical exam findings and initial radiographic and laboratory data in the context of their comorbid conditions. Patient requires inpatient status due to high intensity of service, high risk for further deterioration and high frequency of surveillance required.    Lucile Shuttersochukwu Shamyia Grandpre MD [facility]     08/19/2020, 4:54 PM

## 2020-08-19 NOTE — ED Triage Notes (Signed)
Patient c/o intermittent abdominal pain and N/V x 3 days. Patient states he has been taking zofran and Pepto Bismol with very little relief.

## 2020-08-19 NOTE — ED Notes (Signed)
ED TO INPATIENT HANDOFF REPORT  ED Nurse Name and Phone #: Darl Pikes [phone]  S Name/Age/Gender Edward Wise Last 50 y.o. male Room/Bed: WTR7/WTR7  Code Status   Code Status: Full Code  Home/SNF/Other Home Patient oriented to: self, place, time and situation Is this baseline? Yes   Triage Complete: Triage complete  Chief Complaint Acute gallstone pancreatitis [K85.10]  Triage Note Patient c/o intermittent abdominal pain and N/V x 3 days. Patient states he has been taking zofran and Pepto Bismol with very little relief.    Allergies Allergies  Allergen Reactions  . Penicillins Hives    Level of Care/Admitting Diagnosis ED Disposition    ED Disposition Condition Comment   Admit  [facility]: [facility] [[id number]]  Level of Care: Med-Surg [16]  May admit patient to [facility] or [facility] if equivalent level of care is available:: Yes  Covid Evaluation: Asymptomatic Screening Protocol (No Symptoms)  Diagnosis: Acute gallstone pancreatitis [1324401]  Admitting Physician: Lonia Mad  Attending Physician: Lonia Mad  Estimated length of stay: 3 - 4 days  Certification:: I certify this patient will need inpatient services for at least 2 midnights       B Medical/Surgery History Past Medical History:  Diagnosis Date  . Diabetes mellitus without complication (HCC)   . Hypertension   . Pancreatitis    Past Surgical History:  Procedure Laterality Date  . HERNIA REPAIR       A IV Location/Drains/Wounds Patient Lines/Drains/Airways Status    Active Line/Drains/Airways    Name Placement date Placement time Site Days   Peripheral IV 08/19/20 Right Antecubital 08/19/20  1222  Antecubital  less than 1          Intake/Output Last 24 hours No intake or output data in the 24 hours ending 08/19/20 1806  Labs/Imaging Results for orders placed or performed during the hospital encounter of 08/19/20 (from the past 48  hour(s))  Lipase, blood     Status: Abnormal   Collection Time: 08/19/20 11:30 AM  Result Value Ref Range   Lipase 162 (H) 11 - 51 U/L    Comment: Performed at [facility], [address] [place], [place] [address]  Comprehensive metabolic panel     Status: Abnormal   Collection Time: 08/19/20 11:30 AM  Result Value Ref Range   Sodium 132 (L) 135 - 145 mmol/L   Potassium 4.1 3.5 - 5.1 mmol/L   Chloride 93 (L) 98 - 111 mmol/L   CO2 23 22 - 32 mmol/L   Glucose, Bld 430 (H) 70 - 99 mg/dL    Comment: Glucose reference range applies only to samples taken after fasting for at least 8 hours.   BUN 27 (H) 6 - 20 mg/dL   Creatinine, Ser 3.66 0.61 - 1.24 mg/dL   Calcium 44.0 8.9 - 34.7 mg/dL   Total Protein 9.0 (H) 6.5 - 8.1 g/dL   Albumin 5.2 (H) 3.5 - 5.0 g/dL   AST 21 15 - 41 U/L   ALT 26 0 - 44 U/L   Alkaline Phosphatase 81 38 - 126 U/L   Total Bilirubin 1.2 0.3 - 1.2 mg/dL   GFR, Estimated >42 >59 mL/min    Comment: (NOTE) Calculated using the CKD-EPI Creatinine Equation (2021)    Anion gap 16 (H) 5 - 15    Comment: Performed at [facility], [address], [place], [place] [address]  CBC     Status: None  Collection Time: 08/19/20 11:30 AM  Result Value Ref Range   WBC 6.9 4.0 - 10.5 K/uL   RBC 5.14 4.22 - 5.81 MIL/uL   Hemoglobin 16.0 13.0 - 17.0 g/dL   HCT 16.146.0 09.639.0 - 04.552.0 %   MCV 89.5 80.0 - 100.0 fL   MCH 31.1 26.0 - 34.0 pg   MCHC 34.8 30.0 - 36.0 g/dL   RDW 40.912.5 81.111.5 - 91.415.5 %   Platelets 247 150 - 400 K/uL   nRBC 0.0 0.0 - 0.2 %    Comment: Performed at [facility], [address] [place], [place] [address]  Urinalysis, Routine w reflex microscopic Urine, Clean Catch     Status: Abnormal   Collection Time: 08/19/20  3:07 PM  Result Value Ref Range   Color, Urine YELLOW YELLOW   APPearance CLEAR CLEAR   Specific Gravity, Urine >1.046 (H) 1.005 - 1.030   pH 5.0 5.0 - 8.0   Glucose, UA >=500 (A) NEGATIVE  mg/dL   Hgb urine dipstick NEGATIVE NEGATIVE   Bilirubin Urine NEGATIVE NEGATIVE   Ketones, ur 5 (A) NEGATIVE mg/dL   Protein, ur NEGATIVE NEGATIVE mg/dL   Nitrite NEGATIVE NEGATIVE   Leukocytes,Ua NEGATIVE NEGATIVE   RBC / HPF 0-5 0 - 5 RBC/hpf   WBC, UA 0-5 0 - 5 WBC/hpf   Bacteria, UA NONE SEEN NONE SEEN    Comment: Performed at [facility], [address] [place], [place] [address]   CT Abdomen Pelvis W Contrast  Result Date: 08/19/2020 CLINICAL DATA:  Right lower quadrant abdominal pain. Elevated serum lipase. EXAM: CT ABDOMEN AND PELVIS WITH CONTRAST TECHNIQUE: Multidetector CT imaging of the abdomen and pelvis was performed using the standard protocol following bolus administration of intravenous contrast. CONTRAST:  100mL OMNIPAQUE IOHEXOL 300 MG/ML  SOLN COMPARISON:  CT 01/25/2020 FINDINGS: Lower chest: Included lung bases are clear.  Heart size is normal. Hepatobiliary: Mildly decreased attenuation of the hepatic parenchyma. No focal liver lesion. Gallbladder appears unremarkable. No hyperdense gallstone. No biliary dilatation. Pancreas: Unremarkable. No pancreatic ductal dilatation or surrounding inflammatory changes. Pancreatic parenchyma enhances homogeneously. No adjacent fluid or fluid collections. Spleen: Normal in size without focal abnormality. Adrenals/Urinary Tract: Unremarkable adrenal glands. Stable 12 mm right renal cyst. Kidneys have otherwise symmetric enhancement. No renal stone or hydronephrosis. Ureters and urinary bladder within normal limits. Stomach/Bowel: Stomach is within normal limits. Appendix appears normal (series 2, image 61). No evidence of bowel wall thickening, distention, or inflammatory changes. Vascular/Lymphatic: Minimal scattered aortoiliac atherosclerotic calcifications without aneurysm. No abdominopelvic lymphadenopathy. Reproductive: Prostate is unremarkable. Other: No free fluid. No abdominopelvic fluid collection. No  pneumoperitoneum. No abdominal wall hernia. Musculoskeletal: No acute osseous findings. Mild degenerative changes of the bilateral hips. No suspicious bone lesions. IMPRESSION: 1. No acute findings in the abdomen or pelvis. Specifically, no CT evidence of acute pancreatitis or appendicitis. 2. Mild hepatic steatosis. 3. Aortic atherosclerosis (ICD10-I70.0). Electronically Signed   By: Duanne GuessNicholas  Plundo D.O.   On: 08/19/2020 14:02   US Abdomen Limited RUQ (LIVER/GB)  Result Date: 08/19/2020 CLINICAL DATA:  Right-sided abdominal pain EXAM: ULTRASOUND ABDOMEN LIMITED RIGHT UPPER QUADRANT COMPARISON:  CT from earlier in the same day. FINDINGS: Gallbladder: Well distended with multiple gallstones within. Positive sonographic Murphy's sign is noted. No wall thickening or pericholecystic fluid is noted. Common bile duct: Diameter: 3 mm. Liver: Increased echogenicity is noted consistent with fatty infiltration. Portal vein is patent on color Doppler imaging with normal direction of blood flow  towards the liver. Other: None. IMPRESSION: Cholelithiasis with positive sonographic Murphy's sign. No wall thickening or pericholecystic fluid is noted. Fatty liver. Electronically Signed   By: Alcide Clever M.D.   On: 08/19/2020 15:29    Pending Labs Unresulted Labs (From admission, onward)          Start     Ordered   08/26/20 0500  Creatinine, serum  (enoxaparin (LOVENOX)    CrCl >/= 30 ml/min)  Weekly,   R     Comments: while on enoxaparin therapy    08/19/20 1626   08/20/20 0500  Basic metabolic panel  Tomorrow morning,   R        08/19/20 1626   08/20/20 0500  CBC  Tomorrow morning,   R        08/19/20 1626   08/19/20 1627  Hemoglobin A1c  Once,   STAT       Comments: To assess prior glycemic control    08/19/20 1626   08/19/20 1625  HIV Antibody (routine testing w rflx)  (HIV Antibody (Routine testing w reflex) panel)  Once,   STAT        08/19/20 1626   08/19/20 1615  Resp Panel by RT-PCR (Flu A&B,  Covid) Nasopharyngeal Swab  (Tier 2 - Symptomatic/asymptomatic with Precautions )  Once,   STAT       Question Answer Comment  Is this test for diagnosis or screening Screening   Symptomatic for COVID-19 as defined by CDC No   Hospitalized for COVID-19 No   Admitted to ICU for COVID-19 No   Previously tested for COVID-19 No   Resident in a congregate (group) care setting Unknown   Employed in healthcare setting Unknown   Has patient completed COVID vaccination(s) (2 doses of Pfizer/Moderna 1 dose of [employer]) Unknown      08/19/20 1614          Vitals/Pain Today's Vitals   08/19/20 1020 08/19/20 1207 08/19/20 1255 08/19/20 1509  BP:  (!) 161/111  135/79  Pulse:  91  74  Resp:  20  16  Temp:      TempSrc:      SpO2:  98%  98%  Weight: 97.5 kg     Height: 6\' 2"  (1.88 m)     PainSc: 8   9  5      Isolation Precautions No active isolations  Medications Medications  losartan (COZAAR) tablet 50 mg (has no administration in time range)  DULoxetine (CYMBALTA) DR capsule 60 mg (has no administration in time range)  baclofen (LIORESAL) tablet 5 mg (has no administration in time range)  potassium chloride SA (KLOR-CON) CR tablet 20 mEq (has no administration in time range)  enoxaparin (LOVENOX) injection 40 mg (has no administration in time range)  0.45 % sodium chloride infusion (has no administration in time range)  morphine 2 MG/ML injection 2 mg (has no administration in time range)  ondansetron (ZOFRAN) tablet 4 mg (has no administration in time range)    Or  ondansetron (ZOFRAN) injection 4 mg (has no administration in time range)  pantoprazole (PROTONIX) injection 40 mg (has no administration in time range)  insulin aspart (novoLOG) injection 0-15 Units (has no administration in time range)  hydrALAZINE (APRESOLINE) injection 10 mg (has no administration in time range)  ondansetron (ZOFRAN) injection 4 mg (4 mg Intravenous Given 08/19/20 1223)  sodium chloride 0.9  % bolus 1,000 mL (0 mLs Intravenous Stopped 08/19/20 1507)  morphine  4 MG/ML injection 4 mg (4 mg Intravenous Given 08/19/20 1223)  metoCLOPramide (REGLAN) injection 10 mg (10 mg Intravenous Given 08/19/20 1255)  diphenhydrAMINE (BENADRYL) injection 25 mg (25 mg Intravenous Given 08/19/20 1254)  HYDROmorphone (DILAUDID) injection 0.5 mg (0.5 mg Intravenous Given 08/19/20 1255)  iohexol (OMNIPAQUE) 300 MG/ML solution 100 mL (100 mLs Intravenous Contrast Given 08/19/20 1335)  ondansetron (ZOFRAN) injection 4 mg (4 mg Intravenous Given 08/19/20 1649)  morphine 4 MG/ML injection 4 mg (4 mg Intravenous Given 08/19/20 1649)    Mobility walks Low fall risk   Focused Assessments GI   R Recommendations: See Admitting Provider Note  Report given to:   Additional Notes:

## 2020-08-19 NOTE — ED Provider Notes (Signed)
Emergency Medicine Provider Triage Evaluation Note  Edward Wise , a 50 y.o. male  was evaluated in triage.  Pt complains of abdominal pain, nausea, vomiting.  Symptoms present and worsening over the past 3 days.  Initially abdominal pain was more generalized, now it is more focal to the right side of the abdomen, in particular in the right lower quadrant.  Has had some chills but no fevers.  No prior abdominal surgeries  Review of Systems  Positive: Abdominal pain, nausea, vomiting Negative: Fever, diarrhea  Physical Exam  BP (!) 158/85 (BP Location: Left Arm)   Pulse 81   Temp 97.7 F (36.5 C) (Oral)   Resp 14   Ht 6\' 2"  (1.88 m)   Wt 97.5 kg   SpO2 97%   BMI 27.60 kg/m  Gen:   Awake, no distress   Resp:  Normal effort  MSK:   Moves extremities without difficulty  Other:  Tenderness over the right side of abdomen, most severe in the right lower quadrant  Medical Decision Making  Medically screening exam initiated at 10:32 AM.  Appropriate orders placed.  Nakhi Choi was informed that the remainder of the evaluation will be completed by another provider, this initial triage assessment does not replace that evaluation, and the importance of remaining in the ED until their evaluation is complete.     Berlinda Last, PA-C 08/19/20 1035    10/19/20, MD 08/21/20 1620

## 2020-08-20 DIAGNOSIS — K851 Biliary acute pancreatitis without necrosis or infection: Secondary | ICD-10-CM | POA: Diagnosis not present

## 2020-08-20 LAB — GLUCOSE, CAPILLARY
Glucose-Capillary: 125 mg/dL — ABNORMAL HIGH (ref 70–99)
Glucose-Capillary: 168 mg/dL — ABNORMAL HIGH (ref 70–99)
Glucose-Capillary: 174 mg/dL — ABNORMAL HIGH (ref 70–99)
Glucose-Capillary: 176 mg/dL — ABNORMAL HIGH (ref 70–99)
Glucose-Capillary: 177 mg/dL — ABNORMAL HIGH (ref 70–99)
Glucose-Capillary: 288 mg/dL — ABNORMAL HIGH (ref 70–99)

## 2020-08-20 LAB — CBC
HCT: 40.8 % (ref 39.0–52.0)
Hemoglobin: 13.7 g/dL (ref 13.0–17.0)
MCH: 31 pg (ref 26.0–34.0)
MCHC: 33.6 g/dL (ref 30.0–36.0)
MCV: 92.3 fL (ref 80.0–100.0)
Platelets: 220 10*3/uL (ref 150–400)
RBC: 4.42 MIL/uL (ref 4.22–5.81)
RDW: 12.4 % (ref 11.5–15.5)
WBC: 6.5 10*3/uL (ref 4.0–10.5)
nRBC: 0 % (ref 0.0–0.2)

## 2020-08-20 LAB — BASIC METABOLIC PANEL
Anion gap: 10 (ref 5–15)
BUN: 22 mg/dL — ABNORMAL HIGH (ref 6–20)
CO2: 26 mmol/L (ref 22–32)
Calcium: 9.1 mg/dL (ref 8.9–10.3)
Chloride: 101 mmol/L (ref 98–111)
Creatinine, Ser: 0.96 mg/dL (ref 0.61–1.24)
GFR, Estimated: >60 mL/min (ref >60)
Glucose, Bld: 173 mg/dL — ABNORMAL HIGH (ref 70–99)
Potassium: 3.5 mmol/L (ref 3.5–5.1)
Sodium: 137 mmol/L (ref 135–145)

## 2020-08-20 MED ORDER — LIVING WELL WITH DIABETES BOOK
Freq: Once | Status: AC
  Filled 2020-08-20: qty 1

## 2020-08-20 MED ORDER — TRAMADOL HCL 50 MG PO TABS: 50.0000 mg | ORAL_TABLET | Freq: Four times a day (QID) | ORAL | Status: DC | PRN

## 2020-08-20 MED ORDER — ACETAMINOPHEN 325 MG PO TABS: 650.0000 mg | ORAL_TABLET | Freq: Four times a day (QID) | ORAL | Status: DC | PRN

## 2020-08-20 NOTE — Progress Notes (Signed)
Progress Note     Subjective: Patient reports some improvement in abdominal pain but still having some tenderness. Pain is more cramping and in lower abdomen today. He is passing flatus but has not had a BM since 5/6. Wants to shower today.   Objective: Vital signs in last 24 hours: Temp:  [97.5 F (36.4 C)-98.2 F (36.8 C)] 98 F (36.7 C) (05/10 0622) Pulse Rate:  [72-91] 73 (05/10 0622) Resp:  [14-20] 18 (05/10 0622) BP: (105-161)/(68-111) 105/68 (05/10 0622) SpO2:  [97 %-100 %] 100 % (05/10 0622) Weight:  [97.5 kg] 97.5 kg (05/09 1020) Last BM Date: 08/15/20  Intake/Output from previous day: 05/09 0701 - 05/10 0700 In: 1422.2 [I.V.:1422.2] Out: -  Intake/Output this shift: No intake/output data recorded.  PE: General: pleasant, WD, WN male who is laying in bed in NAD HEENT: Sclera are anicteric.   Heart: regular, rate, and rhythm.   Lungs: CTAB, no wheezes, rhonchi, or rales noted.  Respiratory effort nonlabored Abd: soft, ttp in epigastrium without peritonitis, some guarding, ND, BS hypoactive MS: all 4 extremities are symmetrical with no cyanosis, clubbing, or edema. Skin: warm and dry with no masses, lesions, or rashes Neuro: Cranial nerves 2-12 grossly intact, sensation is normal throughout Psych: A&Ox3 with an appropriate affect.    Lab Results:  Recent Labs    08/19/20 1130 08/20/20 0429  WBC 6.9 6.5  HGB 16.0 13.7  HCT 46.0 40.8  PLT 247 220   BMET Recent Labs    08/19/20 1130 08/20/20 0429  NA 132* 137  K 4.1 3.5  CL 93* 101  CO2 23 26  GLUCOSE 430* 173*  BUN 27* 22*  CREATININE 1.23 0.96  CALCIUM 10.0 9.1   PT/INR No results for input(s): LABPROT, INR in the last 72 hours. CMP     Component Value Date/Time   NA 137 08/20/2020 0429   K 3.5 08/20/2020 0429   CL 101 08/20/2020 0429   CO2 26 08/20/2020 0429   GLUCOSE 173 (H) 08/20/2020 0429   BUN 22 (H) 08/20/2020 0429   CREATININE 0.96 08/20/2020 0429   CALCIUM 9.1 08/20/2020 0429    PROT 9.0 (H) 08/19/2020 1130   ALBUMIN 5.2 (H) 08/19/2020 1130   AST 21 08/19/2020 1130   ALT 26 08/19/2020 1130   ALKPHOS 81 08/19/2020 1130   BILITOT 1.2 08/19/2020 1130   GFRNONAA >60 08/20/2020 0429   Lipase     Component Value Date/Time   LIPASE 162 (H) 08/19/2020 1130       Studies/Results: CT Abdomen Pelvis W Contrast  Result Date: 08/19/2020 CLINICAL DATA:  Right lower quadrant abdominal pain. Elevated serum lipase. EXAM: CT ABDOMEN AND PELVIS WITH CONTRAST TECHNIQUE: Multidetector CT imaging of the abdomen and pelvis was performed using the standard protocol following bolus administration of intravenous contrast. CONTRAST:  OMNIPAQUE IOHEXOL 300 MG/ML  SOLN COMPARISON:  CT 01/25/2020 FINDINGS: Lower chest: Included lung bases are clear.  Heart size is normal. Hepatobiliary: Mildly decreased attenuation of the hepatic parenchyma. No focal liver lesion. Gallbladder appears unremarkable. No hyperdense gallstone. No biliary dilatation. Pancreas: Unremarkable. No pancreatic ductal dilatation or surrounding inflammatory changes. Pancreatic parenchyma enhances homogeneously. No adjacent fluid or fluid collections. Spleen: Normal in size without focal abnormality. Adrenals/Urinary Tract: Unremarkable adrenal glands. Stable 12 mm right renal cyst. Kidneys have otherwise symmetric enhancement. No renal stone or hydronephrosis. Ureters and urinary bladder within normal limits. Stomach/Bowel: Stomach is within normal limits. Appendix appears normal (series 2, image 61). No  evidence of bowel wall thickening, distention, or inflammatory changes. Vascular/Lymphatic: Minimal scattered aortoiliac atherosclerotic calcifications without aneurysm. No abdominopelvic lymphadenopathy. Reproductive: Prostate is unremarkable. Other: No free fluid. No abdominopelvic fluid collection. No pneumoperitoneum. No abdominal wall hernia. Musculoskeletal: No acute osseous findings. Mild degenerative changes of the  bilateral hips. No suspicious bone lesions. IMPRESSION: 1. No acute findings in the abdomen or pelvis. Specifically, no CT evidence of acute pancreatitis or appendicitis. 2. Mild hepatic steatosis. 3. Aortic atherosclerosis (ICD10-I70.0). Electronically Signed   By: Duanne Guess D.O.   On: 08/19/2020 14:02   US Abdomen Limited RUQ (LIVER/GB)  Result Date: 08/19/2020 CLINICAL DATA:  Right-sided abdominal pain EXAM: ULTRASOUND ABDOMEN LIMITED RIGHT UPPER QUADRANT COMPARISON:  CT from earlier in the same day. FINDINGS: Gallbladder: Well distended with multiple gallstones within. Positive sonographic Murphy's sign is noted. No wall thickening or pericholecystic fluid is noted. Common bile duct: Diameter: 3 mm. Liver: Increased echogenicity is noted consistent with fatty infiltration. Portal vein is patent on color Doppler imaging with normal direction of blood flow towards the liver. Other: None. IMPRESSION: Cholelithiasis with positive sonographic Murphy's sign. No wall thickening or pericholecystic fluid is noted. Fatty liver. Electronically Signed   By: Alcide Clever M.D.   On: 08/19/2020 15:29    Anti-infectives: Anti-infectives (From admission, onward)   None       Assessment/Plan Uncontrolled T2DM - blood glucose improving, A1c 9 HTN Hx of alcohol use  Gallstone pancreatitis  - lipase 162 on admit, repeat lipase and LFTs in AM - still too ttp for OR today, but will re-examine for readiness in AM - ok to have CLD today, will make NPO after MN - ok to shower - ok to have PO pain meds  FEN: CLD, IVF VTE: lovenox ID: no abx indicated from a surgical standpoint  LOS: 1 day    Juliet Rude, [facility] 08/20/2020, 8:44 AM Please see Amion for pager number during day hours 7:00am-4:30pm

## 2020-08-20 NOTE — Progress Notes (Addendum)
Inpatient Diabetes Program Recommendations  AACE/ADA: New Consensus Statement on Inpatient Glycemic Control (2015)  Target Ranges:  Prepandial:   less than 140 mg/dL      Peak postprandial:   less than 180 mg/dL (1-2 hours)      Critically ill patients:  140 - 180 mg/dL   Lab Results  Component Value Date   GLUCAP 288 (H) 08/20/2020   HGBA1C 9.7 (H) 08/19/2020    Review of Glycemic Control Results for Edward Wise, Edward Wise (MRN [id number]) as of 08/20/2020 15:15  Ref. Range 08/19/2020 23:41 08/20/2020 04:04 08/20/2020 07:41 08/20/2020 11:55  Glucose-Capillary Latest Ref Range: 70 - 99 mg/dL 444 (H) 619 (H) 012 (H) 288 (H)   Diabetes history:  DM2 Outpatient Diabetes medications:  Metformin 1000 mg BID Current orders for Inpatient glycemic control: Novolog 0-15 units Q4H/NPO  Spoke with [name] at bedside with his significant other.  He has had DM2 for about 4 yrs.  He takes prescribed Metformin 1000 mg BID.  He was taken off Amaryl last year in June by his PCP in [place].  A1C was 8.1 on 12/28/19 and is current 9.7%.  Reviewed patient's current A1c of 9.7% (average blood sugar of 232 mg/dL). Explained what a A1c is and what it measures. Also reviewed goal A1c with patient, importance of good glucose control @ home, and blood sugar goals.  He was surprised to hear this result.  He does not check his blood sugar and does not have a functioning glucometer.  He does not see his PCP in [facility] anymore and needs a new PCP-will ask TOC for assistance.  Discussed importance close follow up on A1C.  We discussed long and short term complications of diabetes and uncontrolled glucose.  Ordered LWWD booklet.  Discussed CHO's, The Plate Method and importance of exercise.   MD please order glucometer at DC-Order # [id number] Also, please consider discharging on additional oral antidiabetic medication such as Amaryl 2 mg daily  Will continue to follow while inpatient.  Thank you, Dulce Sellar, RN,  BSN Diabetes Coordinator Inpatient Diabetes Program [phone] (team pager from 8a-5p)

## 2020-08-20 NOTE — Progress Notes (Signed)
PROGRESS NOTE    Edward Wise  ZHY:[id number] DOB: Nov 15, 1970 DOA: 08/19/2020 PCP: Pcp, No   Brief Narrative:  Edward Wise is a 50 y.o. male with medical history significant for type 2 diabetes mellitus and hypertension who presents to the ER for evaluation of abdominal pain which he has had for 3 days.  Pain is mostly in the epigastrium/periumbilical area as well as right upper quadrant and has been constant for the last 3 days.  He rated his pain a 7 x 10 in intensity at its worst associated with nausea and multiple episodes of emesis. CT scan of abdomen and pelvis shows no acute findings in the abdomen or pelvis. Specifically, no CT evidence of acute pancreatitis or appendicitis.Mild hepatic steatosis. Aortic atherosclerosis. Gallbladder ultrasound showed cholelithiasis with positive sonographic Murphy's sign. No wall thickening or pericholecystic fluid is noted. Fatty liver.  Hospitalist called to admit, surgery consulted for further evaluation for lap chole.  Assessment & Plan:   Principal Problem:   Acute gallstone pancreatitis Active Problems:   Diabetes mellitus with hyperglycemia (HCC)   Hypertension   Acute symptomatic gallstone pancreatitis with intractable nausea vomiting abdominal pain.  POA  -Positive Murphy's on exam and via ultrasound, likely stones as cause of pancreatitis although no stone noted in the biliary tree on imaging, could have passed prior to admission  -Surgery following, plan for lap chole on 08/21/2020   Diabetes mellitus uncontrolled with hyperglycemia -A1c 9.7, lengthy discussion about need for dietary and lifestyle changes  -Continue sliding scale insulin, hypoglycemic protocol   Essential hypertension, uncontrolled  Continue Cozaar Hold hydrochlorothiazide for now IV hydralazine as needed systolic blood pressure greater than 160  DVT prophylaxis: Lovenox Code Status: full code Family Communication: None present  Status is: Inpatient  Dispo:  The patient is from: Home              Anticipated d/c is to: Home              Anticipated d/c date is: 24 to 48 hours              Patient currently not medically stable for discharge  Consultants:   General surgery  Procedures:   Lap chole planned 08/21/2020  Antimicrobials:  None indicated  Subjective: No acute issues or events overnight abdominal pain currently well controlled with n.p.o. status and narcotics.  Denies nausea vomiting diarrhea constipation headache fevers or chills.  Objective: Vitals:   08/19/20 1839 08/19/20 2123 08/20/20 0211 08/20/20 0622  BP: (!) 157/85 139/81 121/68 105/68  Pulse: 72 82 83 73  Resp: 16 18 18 18   Temp: 98 F (36.7 C) (!) 97.5 F (36.4 C) 98.2 F (36.8 C) 98 F (36.7 C)  TempSrc: Oral Oral Oral Oral  SpO2: 100% 98% 100% 100%  Weight:      Height:        Intake/Output Summary (Last 24 hours) at 08/20/2020 0756 Last data filed at 08/20/2020 10/20/2020 Gross per 24 hour  Intake 1422.16 ml  Output --  Net 1422.16 ml   Filed Weights   08/19/20 1020  Weight: 97.5 kg    Examination:  General:  Pleasantly resting in bed, No acute distress. HEENT:  Normocephalic atraumatic.  Sclerae nonicteric, noninjected.  Extraocular movements intact bilaterally. Neck:  Without mass or deformity.  Trachea is midline. Lungs:  Clear to auscultate bilaterally without rhonchi, wheeze, or rales. Heart:  Regular rate and rhythm.  Without murmurs, rubs, or gallops. Abdomen:  Soft, nondistended, right  upper quadrant tenderness with deep palpation.  Without guarding or rebound. Extremities: Without cyanosis, clubbing, edema, or obvious deformity. Vascular:  Dorsalis pedis and posterior tibial pulses palpable bilaterally. Skin:  Warm and dry, no erythema, no ulcerations.  Data Reviewed: I have personally reviewed following labs and imaging studies  CBC: Recent Labs  Lab 08/19/20 1130 08/20/20 0429  WBC 6.9 6.5  HGB 16.0 13.7  HCT 46.0 40.8  MCV  89.5 92.3  PLT 247 220   Basic Metabolic Panel: Recent Labs  Lab 08/19/20 1130 08/20/20 0429  NA 132* 137  K 4.1 3.5  CL 93* 101  CO2 23 26  GLUCOSE 430* 173*  BUN 27* 22*  CREATININE 1.23 0.96  CALCIUM 10.0 9.1   GFR: Estimated Creatinine Clearance: 108.2 mL/min (by C-G formula based on SCr of 0.96 mg/dL). Liver Function Tests: Recent Labs  Lab 08/19/20 1130  AST 21  ALT 26  ALKPHOS 81  BILITOT 1.2  PROT 9.0*  ALBUMIN 5.2*   Recent Labs  Lab 08/19/20 1130  LIPASE 162*   No results for input(s): AMMONIA in the last 168 hours. Coagulation Profile: No results for input(s): INR, PROTIME in the last 168 hours. Cardiac Enzymes: No results for input(s): CKTOTAL, CKMB, CKMBINDEX, TROPONINI in the last 168 hours. BNP (last 3 results) No results for input(s): PROBNP in the last 8760 hours. HbA1C: Recent Labs    08/19/20 1851  HGBA1C 9.7*   CBG: Recent Labs  Lab 08/19/20 2005 08/19/20 2341 08/20/20 0404 08/20/20 0741  GLUCAP 265* 134* 168* 176*   Lipid Profile: No results for input(s): CHOL, HDL, LDLCALC, TRIG, CHOLHDL, LDLDIRECT in the last 72 hours. Thyroid Function Tests: No results for input(s): TSH, T4TOTAL, FREET4, T3FREE, THYROIDAB in the last 72 hours. Anemia Panel: No results for input(s): VITAMINB12, FOLATE, FERRITIN, TIBC, IRON, RETICCTPCT in the last 72 hours. Sepsis Labs: No results for input(s): PROCALCITON, LATICACIDVEN in the last 168 hours.  Recent Results (from the past 240 hour(s))  Resp Panel by RT-PCR (Flu A&B, Covid) Nasopharyngeal Swab     Status: None   Collection Time: 08/19/20  4:53 PM   Specimen: Nasopharyngeal Swab; Nasopharyngeal(NP) swabs in vial transport medium  Result Value Ref Range Status   SARS Coronavirus 2 by RT PCR NEGATIVE NEGATIVE Final    Comment: (NOTE) SARS-CoV-2 target nucleic acids are NOT DETECTED.  The SARS-CoV-2 RNA is generally detectable in upper respiratory specimens during the acute phase of  infection. The lowest concentration of SARS-CoV-2 viral copies this assay can detect is 138 copies/mL. A negative result does not preclude SARS-Cov-2 infection and should not be used as the sole basis for treatment or other patient management decisions. A negative result may occur with  improper specimen collection/handling, submission of specimen other than nasopharyngeal swab, presence of viral mutation(s) within the areas targeted by this assay, and inadequate number of viral copies(<138 copies/mL). A negative result must be combined with clinical observations, patient history, and epidemiological information. The expected result is Negative.  Fact Sheet for Patients:  BloggerCourse.comhttps://www.fda.gov/media/152166/download  Fact Sheet for Healthcare Providers:  SeriousBroker.ithttps://www.fda.gov/media/152162/download  This test is no t yet approved or cleared by the [place] FDA and  has been authorized for detection and/or diagnosis of SARS-CoV-2 by FDA under an Emergency Use Authorization (EUA). This EUA will remain  in effect (meaning this test can be used) for the duration of the COVID-19 declaration under Section 564(b)(1) of the Act, 21 U.S.C.section 360bbb-3(b)(1), unless the authorization is terminated  or  revoked sooner.       Influenza A by PCR NEGATIVE NEGATIVE Final   Influenza B by PCR NEGATIVE NEGATIVE Final    Comment: (NOTE) The Xpert Xpress SARS-CoV-2/FLU/RSV plus assay is intended as an aid in the diagnosis of influenza from Nasopharyngeal swab specimens and should not be used as a sole basis for treatment. Nasal washings and aspirates are unacceptable for Xpert Xpress SARS-CoV-2/FLU/RSV testing.  Fact Sheet for Patients: BloggerCourse.com  Fact Sheet for Healthcare Providers: SeriousBroker.it  This test is not yet approved or cleared by the [place] FDA and has been authorized for detection and/or diagnosis of SARS-CoV-2  by FDA under an Emergency Use Authorization (EUA). This EUA will remain in effect (meaning this test can be used) for the duration of the COVID-19 declaration under Section 564(b)(1) of the Act, 21 U.S.C. section 360bbb-3(b)(1), unless the authorization is terminated or revoked.  Performed at [facility], [address], [place], [place] [address]          Radiology Studies: CT Abdomen Pelvis W Contrast  Result Date: 08/19/2020 CLINICAL DATA:  Right lower quadrant abdominal pain. Elevated serum lipase. EXAM: CT ABDOMEN AND PELVIS WITH CONTRAST TECHNIQUE: Multidetector CT imaging of the abdomen and pelvis was performed using the standard protocol following bolus administration of intravenous contrast. CONTRAST:  OMNIPAQUE IOHEXOL 300 MG/ML  SOLN COMPARISON:  CT 01/25/2020 FINDINGS: Lower chest: Included lung bases are clear.  Heart size is normal. Hepatobiliary: Mildly decreased attenuation of the hepatic parenchyma. No focal liver lesion. Gallbladder appears unremarkable. No hyperdense gallstone. No biliary dilatation. Pancreas: Unremarkable. No pancreatic ductal dilatation or surrounding inflammatory changes. Pancreatic parenchyma enhances homogeneously. No adjacent fluid or fluid collections. Spleen: Normal in size without focal abnormality. Adrenals/Urinary Tract: Unremarkable adrenal glands. Stable 12 mm right renal cyst. Kidneys have otherwise symmetric enhancement. No renal stone or hydronephrosis. Ureters and urinary bladder within normal limits. Stomach/Bowel: Stomach is within normal limits. Appendix appears normal (series 2, image 61). No evidence of bowel wall thickening, distention, or inflammatory changes. Vascular/Lymphatic: Minimal scattered aortoiliac atherosclerotic calcifications without aneurysm. No abdominopelvic lymphadenopathy. Reproductive: Prostate is unremarkable. Other: No free fluid. No abdominopelvic fluid collection. No pneumoperitoneum. No  abdominal wall hernia. Musculoskeletal: No acute osseous findings. Mild degenerative changes of the bilateral hips. No suspicious bone lesions. IMPRESSION: 1. No acute findings in the abdomen or pelvis. Specifically, no CT evidence of acute pancreatitis or appendicitis. 2. Mild hepatic steatosis. 3. Aortic atherosclerosis (ICD10-I70.0). Electronically Signed   By: Duanne Guess D.O.   On: 08/19/2020 14:02   US Abdomen Limited RUQ (LIVER/GB)  Result Date: 08/19/2020 CLINICAL DATA:  Right-sided abdominal pain EXAM: ULTRASOUND ABDOMEN LIMITED RIGHT UPPER QUADRANT COMPARISON:  CT from earlier in the same day. FINDINGS: Gallbladder: Well distended with multiple gallstones within. Positive sonographic Murphy's sign is noted. No wall thickening or pericholecystic fluid is noted. Common bile duct: Diameter: 3 mm. Liver: Increased echogenicity is noted consistent with fatty infiltration. Portal vein is patent on color Doppler imaging with normal direction of blood flow towards the liver. Other: None. IMPRESSION: Cholelithiasis with positive sonographic Murphy's sign. No wall thickening or pericholecystic fluid is noted. Fatty liver. Electronically Signed   By: Alcide Clever M.D.   On: 08/19/2020 15:29   Scheduled Meds: . DULoxetine  60 mg Oral Daily  . enoxaparin (LOVENOX) injection  40 mg Subcutaneous Daily  . insulin aspart  0-15 Units Subcutaneous Q4H  . losartan  50 mg Oral Daily  . pantoprazole (PROTONIX)  IV  40 mg Intravenous Daily  . potassium chloride SA  20 mEq Oral Daily   Continuous Infusions: . sodium chloride 125 mL/hr at 08/20/20 0212     LOS: 1 day   Time spent: 40 min  Azucena Fallen, DO [facility]  If 7PM-7AM, please contact night-coverage [email or web address]  08/20/2020, 7:56 AM

## 2020-08-21 DIAGNOSIS — I10 Essential (primary) hypertension: Secondary | ICD-10-CM

## 2020-08-21 DIAGNOSIS — K851 Biliary acute pancreatitis without necrosis or infection: Secondary | ICD-10-CM | POA: Diagnosis not present

## 2020-08-21 DIAGNOSIS — E876 Hypokalemia: Secondary | ICD-10-CM

## 2020-08-21 DIAGNOSIS — E1165 Type 2 diabetes mellitus with hyperglycemia: Secondary | ICD-10-CM | POA: Diagnosis not present

## 2020-08-21 LAB — COMPREHENSIVE METABOLIC PANEL
ALT: 21 U/L (ref 0–44)
AST: 16 U/L (ref 15–41)
Albumin: 4.2 g/dL (ref 3.5–5.0)
Alkaline Phosphatase: 55 U/L (ref 38–126)
Anion gap: 7 (ref 5–15)
BUN: 15 mg/dL (ref 6–20)
CO2: 26 mmol/L (ref 22–32)
Calcium: 8.7 mg/dL — ABNORMAL LOW (ref 8.9–10.3)
Chloride: 102 mmol/L (ref 98–111)
Creatinine, Ser: 1.08 mg/dL (ref 0.61–1.24)
GFR, Estimated: >60 mL/min (ref >60)
Glucose, Bld: 175 mg/dL — ABNORMAL HIGH (ref 70–99)
Potassium: 3.3 mmol/L — ABNORMAL LOW (ref 3.5–5.1)
Sodium: 135 mmol/L (ref 135–145)
Total Bilirubin: 1.2 mg/dL (ref 0.3–1.2)
Total Protein: 6.6 g/dL (ref 6.5–8.1)

## 2020-08-21 LAB — SURGICAL PCR SCREEN
MRSA, PCR: NEGATIVE
Staphylococcus aureus: NEGATIVE

## 2020-08-21 LAB — CBC
HCT: 39 % (ref 39.0–52.0)
Hemoglobin: 12.9 g/dL — ABNORMAL LOW (ref 13.0–17.0)
MCH: 30.4 pg (ref 26.0–34.0)
MCHC: 33.1 g/dL (ref 30.0–36.0)
MCV: 91.8 fL (ref 80.0–100.0)
Platelets: 173 10*3/uL (ref 150–400)
RBC: 4.25 MIL/uL (ref 4.22–5.81)
RDW: 12.4 % (ref 11.5–15.5)
WBC: 4.7 10*3/uL (ref 4.0–10.5)
nRBC: 0 % (ref 0.0–0.2)

## 2020-08-21 LAB — GLUCOSE, CAPILLARY
Glucose-Capillary: 129 mg/dL — ABNORMAL HIGH (ref 70–99)
Glucose-Capillary: 134 mg/dL — ABNORMAL HIGH (ref 70–99)
Glucose-Capillary: 138 mg/dL — ABNORMAL HIGH (ref 70–99)
Glucose-Capillary: 140 mg/dL — ABNORMAL HIGH (ref 70–99)
Glucose-Capillary: 168 mg/dL — ABNORMAL HIGH (ref 70–99)
Glucose-Capillary: 99 mg/dL (ref 70–99)

## 2020-08-21 LAB — HEMOGLOBIN AND HEMATOCRIT, BLOOD
HCT: 36.3 % — ABNORMAL LOW (ref 39.0–52.0)
Hemoglobin: 12.3 g/dL — ABNORMAL LOW (ref 13.0–17.0)

## 2020-08-21 LAB — MAGNESIUM: Magnesium: 2 mg/dL (ref 1.7–2.4)

## 2020-08-21 LAB — LIPASE, BLOOD: Lipase: 31 U/L (ref 11–51)

## 2020-08-21 MED ORDER — SODIUM CHLORIDE 0.9 % IV BOLUS
1000.0000 mL | Freq: Once | INTRAVENOUS | Status: AC
  Administered 2020-08-21: 1000 mL via INTRAVENOUS

## 2020-08-21 MED ORDER — POTASSIUM CHLORIDE CRYS ER 20 MEQ PO TBCR
20.0000 meq | EXTENDED_RELEASE_TABLET | Freq: Once | ORAL | Status: AC
  Administered 2020-08-21: 20 meq via ORAL
  Filled 2020-08-21: qty 1

## 2020-08-21 MED ORDER — PANTOPRAZOLE SODIUM 40 MG IV SOLR
40.0000 mg | Freq: Two times a day (BID) | INTRAVENOUS | Status: DC
  Administered 2020-08-21 – 2020-08-22 (×2): 40 mg via INTRAVENOUS
  Filled 2020-08-21 (×2): qty 40

## 2020-08-21 MED ORDER — POTASSIUM CHLORIDE CRYS ER 20 MEQ PO TBCR: 40.0000 meq | EXTENDED_RELEASE_TABLET | Freq: Once | ORAL | Status: DC

## 2020-08-21 MED ORDER — MORPHINE SULFATE (PF) 2 MG/ML IV SOLN
2.0000 mg | INTRAVENOUS | Status: DC | PRN
Start: 2020-08-21 — End: 2020-08-23
  Administered 2020-08-21 – 2020-08-22 (×3): 2 mg via INTRAVENOUS
  Filled 2020-08-21 (×3): qty 1

## 2020-08-21 NOTE — H&P (View-Only) (Signed)
Progress Note  Day of Surgery  Subjective: Patient reports he had a BM yesterday, still having some abdominal pain but overall improving. Pain did not seem worsened by CLD yesterday.   Objective: Vital signs in last 24 hours: Temp:  [98.4 F (36.9 C)-98.8 F (37.1 C)] 98.4 F (36.9 C) (05/11 0618) Pulse Rate:  [68-71] 71 (05/11 0618) Resp:  [17-18] 18 (05/11 0618) BP: (116-127)/(69-85) 122/85 (05/11 0618) SpO2:  [98 %-100 %] 100 % (05/11 0618) Last BM Date: 08/20/20  Intake/Output from previous day: 05/10 0701 - 05/11 0700 In: 3691.3 [P.O.:840; I.V.:2851.3] Out: -  Intake/Output this shift: No intake/output data recorded.  PE: General: pleasant, WD, WN male who is laying in bed in NAD HEENT: Sclera are anicteric.   Heart: regular, rate, and rhythm.   Lungs: CTAB, no wheezes, rhonchi, or rales noted.  Respiratory effort nonlabored Abd: soft, very mild ttp in epigastrium without peritonitis, some guarding, ND, BS hypoactive MS: all 4 extremities are symmetrical with no cyanosis, clubbing, or edema. Skin: warm and dry with no masses, lesions, or rashes Neuro: Cranial nerves 2-12 grossly intact, sensation is normal throughout Psych: A&Ox3 with an appropriate affect.    Lab Results:  Recent Labs    08/20/20 0429 08/21/20 0435  WBC 6.5 4.7  HGB 13.7 12.9*  HCT 40.8 39.0  PLT 220 173   BMET Recent Labs    08/20/20 0429 08/21/20 0435  NA 137 135  K 3.5 3.3*  CL 101 102  CO2 26 26  GLUCOSE 173* 175*  BUN 22* 15  CREATININE 0.96 1.08  CALCIUM 9.1 8.7*   PT/INR No results for input(s): LABPROT, INR in the last 72 hours. CMP     Component Value Date/Time   NA 135 08/21/2020 0435   K 3.3 (L) 08/21/2020 0435   CL 102 08/21/2020 0435   CO2 26 08/21/2020 0435   GLUCOSE 175 (H) 08/21/2020 0435   BUN 15 08/21/2020 0435   CREATININE 1.08 08/21/2020 0435   CALCIUM 8.7 (L) 08/21/2020 0435   PROT 6.6 08/21/2020 0435   ALBUMIN 4.2 08/21/2020 0435   AST 16  08/21/2020 0435   ALT 21 08/21/2020 0435   ALKPHOS 55 08/21/2020 0435   BILITOT 1.2 08/21/2020 0435   GFRNONAA >60 08/21/2020 0435   Lipase     Component Value Date/Time   LIPASE 31 08/21/2020 0435       Studies/Results: CT Abdomen Pelvis W Contrast  Result Date: 08/19/2020 CLINICAL DATA:  Right lower quadrant abdominal pain. Elevated serum lipase. EXAM: CT ABDOMEN AND PELVIS WITH CONTRAST TECHNIQUE: Multidetector CT imaging of the abdomen and pelvis was performed using the standard protocol following bolus administration of intravenous contrast. CONTRAST:  OMNIPAQUE IOHEXOL 300 MG/ML  SOLN COMPARISON:  CT 01/25/2020 FINDINGS: Lower chest: Included lung bases are clear.  Heart size is normal. Hepatobiliary: Mildly decreased attenuation of the hepatic parenchyma. No focal liver lesion. Gallbladder appears unremarkable. No hyperdense gallstone. No biliary dilatation. Pancreas: Unremarkable. No pancreatic ductal dilatation or surrounding inflammatory changes. Pancreatic parenchyma enhances homogeneously. No adjacent fluid or fluid collections. Spleen: Normal in size without focal abnormality. Adrenals/Urinary Tract: Unremarkable adrenal glands. Stable 12 mm right renal cyst. Kidneys have otherwise symmetric enhancement. No renal stone or hydronephrosis. Ureters and urinary bladder within normal limits. Stomach/Bowel: Stomach is within normal limits. Appendix appears normal (series 2, image 61). No evidence of bowel wall thickening, distention, or inflammatory changes. Vascular/Lymphatic: Minimal scattered aortoiliac atherosclerotic calcifications without aneurysm. No abdominopelvic lymphadenopathy.  Reproductive: Prostate is unremarkable. Other: No free fluid. No abdominopelvic fluid collection. No pneumoperitoneum. No abdominal wall hernia. Musculoskeletal: No acute osseous findings. Mild degenerative changes of the bilateral hips. No suspicious bone lesions. IMPRESSION: 1. No acute findings in  the abdomen or pelvis. Specifically, no CT evidence of acute pancreatitis or appendicitis. 2. Mild hepatic steatosis. 3. Aortic atherosclerosis (ICD10-I70.0). Electronically Signed   By: Duanne Guess D.O.   On: 08/19/2020 14:02   US Abdomen Limited RUQ (LIVER/GB)  Result Date: 08/19/2020 CLINICAL DATA:  Right-sided abdominal pain EXAM: ULTRASOUND ABDOMEN LIMITED RIGHT UPPER QUADRANT COMPARISON:  CT from earlier in the same day. FINDINGS: Gallbladder: Well distended with multiple gallstones within. Positive sonographic Murphy's sign is noted. No wall thickening or pericholecystic fluid is noted. Common bile duct: Diameter: 3 mm. Liver: Increased echogenicity is noted consistent with fatty infiltration. Portal vein is patent on color Doppler imaging with normal direction of blood flow towards the liver. Other: None. IMPRESSION: Cholelithiasis with positive sonographic Murphy's sign. No wall thickening or pericholecystic fluid is noted. Fatty liver. Electronically Signed   By: Alcide Clever M.D.   On: 08/19/2020 15:29    Anti-infectives: Anti-infectives (From admission, onward)   None       Assessment/Plan Uncontrolled T2DM - blood glucose improving, A1c 9 HTN Hx of alcohol use  Gallstone pancreatitis  - lipase 162 on admit, 31 this AM; LFTs otherwise normal  - less ttp today - ok to proceed to OR for lap chole  - discussed procedure with patient and reviewed risks and benefits and he is agreeable to proceed   FEN: NPO VTE: lovenox ID: [email or web address] pre-op  LOS: 2 days    Juliet Rude, [facility] Surgery 08/21/2020, 10:04 AM Please see Amion for pager number during day hours 7:00am-4:30pm

## 2020-08-21 NOTE — Progress Notes (Signed)
Notified floor nurse that patients surgery is canceled for today and will be done tomorrow 08/22/20 at 1200.  Patient to be npo after midnight tonight.

## 2020-08-21 NOTE — Progress Notes (Addendum)
PROGRESS NOTE    Edward Wise  YHC:[id number] DOB: 02-18-1971 DOA: 08/19/2020 PCP: Pcp, No    Chief Complaint  Patient presents with  . Emesis  . Abdominal Pain    Brief Narrative:  Edward Wise a 50 y.o.malewith medical history significant fortype 2 diabetes mellitus and hypertension who presents to the ER for evaluation of abdominal pain which he has had for 3 days. Pain is mostly in the epigastrium/periumbilical area as well as right upper quadrant and has been constant for the last 3 days. He rated his pain a 7 x 10 in intensity at its worst associated with nausea and multiple episodes of emesis. CT scan of abdomen and pelvis showsno acute findings in the abdomen or pelvis. Specifically, no CT evidence of acute pancreatitis or appendicitis.Mild hepatic steatosis. Aortic atherosclerosis. Gallbladder ultrasound showedcholelithiasis with positive sonographic Murphy's sign. No wall thickening or pericholecystic fluid is noted. Fatty liver.  Hospitalist called to admit, surgery consulted for further evaluation for lap chole.   Assessment & Plan:   Principal Problem:   Acute gallstone pancreatitis Active Problems:   Diabetes mellitus with hyperglycemia (HCC)   Hypertension   Hypokalemia  1 acute gallstone pancreatitis with intractable nausea vomiting abdominal pain, POA -Patient had presented with abdominal pain mostly in the epigastric/periumbilical region, right upper quadrant pain. -CT abdomen and pelvis done on presentation with no acute findings in the abdomen or pelvis, specifically no CT evidence of acute pancreatitis or appendicitis, mild hepatic steatosis, aortic atherosclerosis. -Lipase noted to be elevated at 162 on presentation with trended down currently at 31. -Right upper quadrant ultrasound done with cholelithiasis, positive sonographic Murphy sign, no wall thickening or pericholecystic fluid noted, fatty liver. -Patient improving clinically however still with  some epigastric abdominal pain.  Tolerating clears. -Patient for laparoscopic cholecystectomy today.  2.  Uncontrolled diabetes mellitus type 2 -Hemoglobin A1c 9.7 (08/19/2020) -CBG 129 this morning. -Continue to hold oral hypoglycemic agents. -SSI.  3. hypertension -Currently stable. -Continue Cozaar. -HCTZ on hold.  4.  Hypokalemia -K-Dur 40 mEq p.o. x1. -Magnesium level at 2.0.    DVT prophylaxis: Lovenox Code Status: Full Family Communication: Updated patient.  No family at bedside. Disposition:   Status is: Inpatient    Dispo: The patient is from: Home              Anticipated d/c is to: Home              Patient currently with a gallstone pancreatitis, being followed by general surgery who are planning on laparoscopic cholecystectomy once acute pancreatitis has improved   Difficult to place patient: No       Consultants:   General surgery: Dr. Donell Beers 08/19/2020  Procedures:   CT abdomen and pelvis 08/19/2020  Right upper quadrant ultrasound 08/19/2020  Antimicrobials:   None   Subjective: Patient walking in room.  Stated just had a shower.  Denies any nausea or emesis.  No chest pain.  No shortness of breath.  Still with some epigastric upper abdominal pain however improved significantly since admission.  Stated received some pain medication about 2 hours ago.  Stated tolerated clears however had a watery blackish bowel movement x1 yesterday evening.  Objective: Vitals:   08/20/20 1329 08/20/20 2052 08/21/20 0618 08/21/20 1318  BP: 116/80 127/69 122/85 137/89  Pulse: 70 68 71 65  Resp: 17 18 18 18   Temp: 98.4 F (36.9 C) 98.8 F (37.1 C) 98.4 F (36.9 C) 98.8 F (37.1 C)  TempSrc: Oral  Oral Oral Oral  SpO2: 100% 98% 100% 100%  Weight:      Height:        Intake/Output Summary (Last 24 hours) at 08/21/2020 1619 Last data filed at 08/21/2020 0622 Gross per 24 hour  Intake 2825.72 ml  Output --  Net 2825.72 ml   Filed Weights   08/19/20 1020   Weight: 97.5 kg    Examination:  General exam: Appears calm and comfortable  Respiratory system: Clear to auscultation bilaterally.  No wheezes, no crackles, no rhonchi.Marland Kitchen Respiratory effort normal. Cardiovascular system: S1 & S2 heard, RRR. No JVD, murmurs, rubs, gallops or clicks. No pedal edema. Gastrointestinal system: Abdomen is nondistended, soft and TTP in the epigastric region.  Positive bowel sounds.  No rebound.  No guarding.  Central nervous system: Alert and oriented. No focal neurological deficits. Extremities: Symmetric 5 x 5 power. Skin: No rashes, lesions or ulcers Psychiatry: Judgement and insight appear normal. Mood & affect appropriate.     Data Reviewed: I have personally reviewed following labs and imaging studies  CBC: Recent Labs  Lab 08/19/20 1130 08/20/20 0429 08/21/20 0435  WBC 6.9 6.5 4.7  HGB 16.0 13.7 12.9*  HCT 46.0 40.8 39.0  MCV 89.5 92.3 91.8  PLT 247 220 173    Basic Metabolic Panel: Recent Labs  Lab 08/19/20 1130 08/20/20 0429 08/21/20 0435  NA 132* 137 135  K 4.1 3.5 3.3*  CL 93* 101 102  CO2 23 26 26   GLUCOSE 430* 173* 175*  BUN 27* 22* 15  CREATININE 1.23 0.96 1.08  CALCIUM 10.0 9.1 8.7*  MG  --   --  2.0    GFR: Estimated Creatinine Clearance: 96.2 mL/min (by C-G formula based on SCr of 1.08 mg/dL).  Liver Function Tests: Recent Labs  Lab 08/19/20 1130 08/21/20 0435  AST 21 16  ALT 26 21  ALKPHOS 81 55  BILITOT 1.2 1.2  PROT 9.0* 6.6  ALBUMIN 5.2* 4.2    CBG: Recent Labs  Lab 08/20/20 2352 08/21/20 0342 08/21/20 0723 08/21/20 1109 08/21/20 1529  GLUCAP 125* 138* 129* 168* 134*     Recent Results (from the past 240 hour(s))  Resp Panel by RT-PCR (Flu A&B, Covid) Nasopharyngeal Swab     Status: None   Collection Time: 08/19/20  4:53 PM   Specimen: Nasopharyngeal Swab; Nasopharyngeal(NP) swabs in vial transport medium  Result Value Ref Range Status   SARS Coronavirus 2 by RT PCR NEGATIVE NEGATIVE  Final    Comment: (NOTE) SARS-CoV-2 target nucleic acids are NOT DETECTED.  The SARS-CoV-2 RNA is generally detectable in upper respiratory specimens during the acute phase of infection. The lowest concentration of SARS-CoV-2 viral copies this assay can detect is 138 copies/mL. A negative result does not preclude SARS-Cov-2 infection and should not be used as the sole basis for treatment or other patient management decisions. A negative result may occur with  improper specimen collection/handling, submission of specimen other than nasopharyngeal swab, presence of viral mutation(s) within the areas targeted by this assay, and inadequate number of viral copies(<138 copies/mL). A negative result must be combined with clinical observations, patient history, and epidemiological information. The expected result is Negative.  Fact Sheet for Patients:  10/19/20  Fact Sheet for Healthcare Providers:  BloggerCourse.com  This test is no t yet approved or cleared by the [employer] FDA and  has been authorized for detection and/or diagnosis of SARS-CoV-2 by FDA under an Emergency Use Authorization (EUA). This EUA will remain  in effect (meaning this test can be used) for the duration of the COVID-19 declaration under Section 564(b)(1) of the Act, 21 U.S.C.section 360bbb-3(b)(1), unless the authorization is terminated  or revoked sooner.       Influenza A by PCR NEGATIVE NEGATIVE Final   Influenza B by PCR NEGATIVE NEGATIVE Final    Comment: (NOTE) The Xpert Xpress SARS-CoV-2/FLU/RSV plus assay is intended as an aid in the diagnosis of influenza from Nasopharyngeal swab specimens and should not be used as a sole basis for treatment. Nasal washings and aspirates are unacceptable for Xpert Xpress SARS-CoV-2/FLU/RSV testing.  Fact Sheet for Patients: BloggerCourse.com  Fact Sheet for Healthcare  Providers: [employer]ious[employer]ker[employer]it  This test is not yet approved or cleared by the [place] FDA and has been authorized for detection and/or diagnosis of SARS-CoV-2 by FDA under an Emergency Use Authorization (EUA). This EUA will remain in effect (meaning this test can be used) for the duration of the COVID-19 declaration under Section 564(b)(1) of the Act, 21 U.S.C. section 360bbb-3(b)(1), unless the authorization is terminated or revoked.  Performed at [facility], [address] [place], [place] [address]   Surgical pcr screen     Status: None   Collection Time: 08/21/20  3:54 AM   Specimen: Nasal Mucosa; Nasal Swab  Result Value Ref Range Status   MRSA, PCR NEGATIVE NEGATIVE Final   Staphylococcus aureus NEGATIVE NEGATIVE Final    Comment: (NOTE) The Xpert SA Assay (FDA approved for NASAL specimens in patients 45 years of age and older), is one component of a comprehensive surveillance program. It is not intended to diagnose infection nor to guide or monitor treatment. Performed at [facility], [address], [place], [place] [address]          Radiology Studies: No results found.      Scheduled Meds: . DULoxetine  60 mg Oral Daily  . enoxaparin (LOVENOX) injection  40 mg Subcutaneous Daily  . insulin aspart  0-15 Units Subcutaneous Q4H  . losartan  50 mg Oral Daily  . pantoprazole (PROTONIX) IV  40 mg Intravenous Daily   Continuous Infusions: . sodium chloride 125 mL/hr at 08/21/20 0317     LOS: 2 days    Time spent: 35 minutes    Ramiro Harvest, MD [facility]   To contact the attending provider between 7A-7P or the covering provider during after hours 7P-7A, please log into the web site [email or web address] and access using universal [place] password for that web site. If you do not have the password, please call the hospital operator.  08/21/2020, 4:19 PM

## 2020-08-21 NOTE — Discharge Instructions (Signed)
[facility] LAPAROSCOPIC SURGERY: POST OP INSTRUCTIONS Always review your discharge instruction sheet given to you by the facility where your surgery was performed. IF YOU HAVE DISABILITY OR FAMILY LEAVE FORMS, YOU MUST BRING THEM TO THE OFFICE FOR PROCESSING.   DO NOT GIVE THEM TO YOUR DOCTOR.  PAIN CONTROL  1. First take acetaminophen (Tylenol) AND/or ibuprofen (Advil) to control your pain after surgery.  Follow directions on package.  Taking acetaminophen (Tylenol) and/or ibuprofen (Advil) regularly after surgery will help to control your pain and lower the amount of prescription pain medication you may need.  You should not take more than 3,000 mg (3 grams) of acetaminophen (Tylenol) in 24 hours.  You should not take ibuprofen (Advil), aleve, motrin, naprosyn or other NSAIDS if you have a history of stomach ulcers or chronic kidney disease.  2. A prescription for pain medication may be given to you upon discharge.  Take your pain medication as prescribed, if you still have uncontrolled pain after taking acetaminophen (Tylenol) or ibuprofen (Advil). 3. Use ice packs to help control pain. 4. If you need a refill on your pain medication, please contact your pharmacy.  They will contact our office to request authorization. Prescriptions will not be filled after 5pm or on week-ends.  HOME MEDICATIONS 5. Take your usually prescribed medications unless otherwise directed.  DIET 6. You should follow a light diet the first few days after arrival home.  Be sure to include lots of fluids daily. Avoid fatty, fried foods.   CONSTIPATION 7. It is common to experience some constipation after surgery and if you are taking pain medication.  Increasing fluid intake and taking a stool softener (such as Colace) will usually help or prevent this problem from occurring.  A mild laxative (Milk of Magnesia or Miralax) should be taken according to package instructions if there are no bowel  movements after 48 hours.  WOUND/INCISION CARE 8. Most patients will experience some swelling and bruising in the area of the incisions.  Ice packs will help.  Swelling and bruising can take several days to resolve.  9. Unless discharge instructions indicate otherwise, follow guidelines below  a. STERI-STRIPS - you may remove your outer bandages 48 hours after surgery, and you may shower at that time.  You have steri-strips (small skin tapes) in place directly over the incision.  These strips should be left on the skin for 7-10 days.   b. DERMABOND/SKIN GLUE - you may shower in 24 hours.  The glue will flake off over the next 2-3 weeks. 10. Any sutures or staples will be removed at the office during your follow-up visit.  ACTIVITIES 11. You may resume regular (light) daily activities beginning the next day--such as daily self-care, walking, climbing stairs--gradually increasing activities as tolerated.  You may have sexual intercourse when it is comfortable.  Refrain from any heavy lifting or straining until approved by your doctor. a. You may drive when you are no longer taking prescription pain medication, you can comfortably wear a seatbelt, and you can safely maneuver your car and apply brakes.  FOLLOW-UP 12. You should see your doctor in the office for a follow-up appointment approximately 2-3 weeks after your surgery.  You should have been given your post-op/follow-up appointment when your surgery was scheduled.  If you did not receive a post-op/follow-up appointment, make sure that you call for this appointment within a day or two after you arrive home to insure a convenient appointment time.     WHEN TO CALL YOUR DOCTOR: 1. Fever over 101.0 2. Inability to urinate 3. Continued bleeding from incision. 4. Increased pain, redness, or drainage from the incision. 5. Increasing abdominal pain  The clinic staff is available to answer your questions during regular business hours.  Please don't  hesitate to call and ask to speak to one of the nurses for clinical concerns.  If you have a medical emergency, go to the nearest emergency room or call 911.  A surgeon from [facility] is always on call at the hospital. [address], [address], [place], [place]  [address] ? [address] [address], [place], [place]   [address] [phone] ? [phone] ? FAX [phone] Web site: [email or web address]  .........   Managing Your Pain After Surgery Without Opioids    Thank you for participating in our program to help patients manage their pain after surgery without opioids. This is part of our effort to provide you with the best care possible, without exposing you or your family to the risk that opioids pose.  What pain can I expect after surgery? You can expect to have some pain after surgery. This is normal. The pain is typically worse the day after surgery, and quickly begins to get better. Many studies have found that many patients are able to manage their pain after surgery with Over-the-Counter (OTC) medications such as Tylenol and Motrin. If you have a condition that does not allow you to take Tylenol or Motrin, notify your surgical team.  How will I manage my pain? The best strategy for controlling your pain after surgery is around the clock pain control with Tylenol (acetaminophen) and Motrin (ibuprofen or Advil). Alternating these medications with each other allows you to maximize your pain control. In addition to Tylenol and Motrin, you can use heating pads or ice packs on your incisions to help reduce your pain.  How will I alternate your regular strength over-the-counter pain medication? You will take a dose of pain medication every three hours. ; Start by taking 650 mg of Tylenol (2 pills of 325 mg) ; 3 hours later take 600 mg of Motrin (3 pills of 200 mg) ; 3 hours after taking the Motrin take 650 mg of Tylenol ; 3 hours after that take 600 mg of  Motrin.   - 1 -  See example - if your first dose of Tylenol is at 12:00 PM   12:00 PM Tylenol 650 mg (2 pills of 325 mg)  3:00 PM Motrin 600 mg (3 pills of 200 mg)  6:00 PM Tylenol 650 mg (2 pills of 325 mg)  9:00 PM Motrin 600 mg (3 pills of 200 mg)  Continue alternating every 3 hours   We recommend that you follow this schedule around-the-clock for at least 3 days after surgery, or until you feel that it is no longer needed. Use the table on the last page of this handout to keep track of the medications you are taking. Important: Do not take more than 3000mg of Tylenol or 3200mg of Motrin in a 24-hour period. Do not take ibuprofen/Motrin if you have a history of bleeding stomach ulcers, severe kidney disease, &/or actively taking a blood thinner  What if I still have pain? If you have pain that is not controlled with the over-the-counter pain medications (Tylenol and Motrin or Advil) you might have what we call "breakthrough" pain. You will receive a prescription for a small amount of an opioid pain medication such as   Oxycodone, Tramadol, or Tylenol with Codeine. Use these opioid pills in the first 24 hours after surgery if you have breakthrough pain. Do not take more than 1 pill every 4-6 hours.  If you still have uncontrolled pain after using all opioid pills, don't hesitate to call our staff using the number provided. We will help make sure you are managing your pain in the best way possible, and if necessary, we can provide a prescription for additional pain medication.   Day 1    Time  Name of Medication Number of pills taken  Amount of Acetaminophen  Pain Level   Comments  AM PM       AM PM       AM PM       AM PM       AM PM       AM PM       AM PM       AM PM       Total Daily amount of Acetaminophen Do not take more than  3,000 mg per day      Day 2    Time  Name of Medication Number of pills taken  Amount of Acetaminophen  Pain Level   Comments  AM  PM       AM PM       AM PM       AM PM       AM PM       AM PM       AM PM       AM PM       Total Daily amount of Acetaminophen Do not take more than  3,000 mg per day      Day 3    Time  Name of Medication Number of pills taken  Amount of Acetaminophen  Pain Level   Comments  AM PM       AM PM       AM PM       AM PM          AM PM       AM PM       AM PM       AM PM       Total Daily amount of Acetaminophen Do not take more than  3,000 mg per day      Day 4    Time  Name of Medication Number of pills taken  Amount of Acetaminophen  Pain Level   Comments  AM PM       AM PM       AM PM       AM PM       AM PM       AM PM       AM PM       AM PM       Total Daily amount of Acetaminophen Do not take more than  3,000 mg per day      Day 5    Time  Name of Medication Number of pills taken  Amount of Acetaminophen  Pain Level   Comments  AM PM       AM PM       AM PM       AM PM       AM PM       AM PM       AM PM         AM PM       Total Daily amount of Acetaminophen Do not take more than  3,000 mg per day       Day 6    Time  Name of Medication Number of pills taken  Amount of Acetaminophen  Pain Level  Comments  AM PM       AM PM       AM PM       AM PM       AM PM       AM PM       AM PM       AM PM       Total Daily amount of Acetaminophen Do not take more than  3,000 mg per day      Day 7    Time  Name of Medication Number of pills taken  Amount of Acetaminophen  Pain Level   Comments  AM PM       AM PM       AM PM       AM PM       AM PM       AM PM       AM PM       AM PM       Total Daily amount of Acetaminophen Do not take more than  3,000 mg per day        For additional information about how and where to safely dispose of unused opioid medications - https://www.morepowerful[employer].org  Disclaimer: This document contains information and/or instructional materials adapted from [facility]  for the typical patient with your condition. It does not replace medical advice from your health care provider because your experience may differ from that of the typical patient. Talk to your health care provider if you have any questions about this document, your condition or your treatment plan. Adapted from [facility]  

## 2020-08-21 NOTE — Progress Notes (Signed)
 Progress Note  Day of Surgery  Subjective: Patient reports he had a BM yesterday, still having some abdominal pain but overall improving. Pain did not seem worsened by CLD yesterday.   Objective: Vital signs in last 24 hours: Temp:  [98.4 F (36.9 C)-98.8 F (37.1 C)] 98.4 F (36.9 C) (05/11 0618) Pulse Rate:  [68-71] 71 (05/11 0618) Resp:  [17-18] 18 (05/11 0618) BP: (116-127)/(69-85) 122/85 (05/11 0618) SpO2:  [98 %-100 %] 100 % (05/11 0618) Last BM Date: 08/20/20  Intake/Output from previous day: 05/10 0701 - 05/11 0700 In: 3691.3 [P.O.:840; I.V.:2851.3] Out: -  Intake/Output this shift: No intake/output data recorded.  PE: General: pleasant, WD, WN male who is laying in bed in NAD HEENT: Sclera are anicteric.   Heart: regular, rate, and rhythm.   Lungs: CTAB, no wheezes, rhonchi, or rales noted.  Respiratory effort nonlabored Abd: soft, very mild ttp in epigastrium without peritonitis, some guarding, ND, BS hypoactive MS: all 4 extremities are symmetrical with no cyanosis, clubbing, or edema. Skin: warm and dry with no masses, lesions, or rashes Neuro: Cranial nerves 2-12 grossly intact, sensation is normal throughout Psych: A&Ox3 with an appropriate affect.    Lab Results:  Recent Labs    08/20/20 0429 08/21/20 0435  WBC 6.5 4.7  HGB 13.7 12.9*  HCT 40.8 39.0  PLT 220 173   BMET Recent Labs    08/20/20 0429 08/21/20 0435  NA 137 135  K 3.5 3.3*  CL 101 102  CO2 26 26  GLUCOSE 173* 175*  BUN 22* 15  CREATININE 0.96 1.08  CALCIUM 9.1 8.7*   PT/INR No results for input(s): LABPROT, INR in the last 72 hours. CMP     Component Value Date/Time   NA 135 08/21/2020 0435   K 3.3 (L) 08/21/2020 0435   CL 102 08/21/2020 0435   CO2 26 08/21/2020 0435   GLUCOSE 175 (H) 08/21/2020 0435   BUN 15 08/21/2020 0435   CREATININE 1.08 08/21/2020 0435   CALCIUM 8.7 (L) 08/21/2020 0435   PROT 6.6 08/21/2020 0435   ALBUMIN 4.2 08/21/2020 0435   AST 16  08/21/2020 0435   ALT 21 08/21/2020 0435   ALKPHOS 55 08/21/2020 0435   BILITOT 1.2 08/21/2020 0435   GFRNONAA >60 08/21/2020 0435   Lipase     Component Value Date/Time   LIPASE 31 08/21/2020 0435       Studies/Results: CT Abdomen Pelvis W Contrast  Result Date: 08/19/2020 CLINICAL DATA:  Right lower quadrant abdominal pain. Elevated serum lipase. EXAM: CT ABDOMEN AND PELVIS WITH CONTRAST TECHNIQUE: Multidetector CT imaging of the abdomen and pelvis was performed using the standard protocol following bolus administration of intravenous contrast. CONTRAST:  100mL OMNIPAQUE IOHEXOL 300 MG/ML  SOLN COMPARISON:  CT 01/25/2020 FINDINGS: Lower chest: Included lung bases are clear.  Heart size is normal. Hepatobiliary: Mildly decreased attenuation of the hepatic parenchyma. No focal liver lesion. Gallbladder appears unremarkable. No hyperdense gallstone. No biliary dilatation. Pancreas: Unremarkable. No pancreatic ductal dilatation or surrounding inflammatory changes. Pancreatic parenchyma enhances homogeneously. No adjacent fluid or fluid collections. Spleen: Normal in size without focal abnormality. Adrenals/Urinary Tract: Unremarkable adrenal glands. Stable 12 mm right renal cyst. Kidneys have otherwise symmetric enhancement. No renal stone or hydronephrosis. Ureters and urinary bladder within normal limits. Stomach/Bowel: Stomach is within normal limits. Appendix appears normal (series 2, image 61). No evidence of bowel wall thickening, distention, or inflammatory changes. Vascular/Lymphatic: Minimal scattered aortoiliac atherosclerotic calcifications without aneurysm. No abdominopelvic lymphadenopathy.   Reproductive: Prostate is unremarkable. Other: No free fluid. No abdominopelvic fluid collection. No pneumoperitoneum. No abdominal wall hernia. Musculoskeletal: No acute osseous findings. Mild degenerative changes of the bilateral hips. No suspicious bone lesions. IMPRESSION: 1. No acute findings in  the abdomen or pelvis. Specifically, no CT evidence of acute pancreatitis or appendicitis. 2. Mild hepatic steatosis. 3. Aortic atherosclerosis (ICD10-I70.0). Electronically Signed   By: Duanne Guess D.O.   On: 08/19/2020 14:02   US Abdomen Limited RUQ (LIVER/GB)  Result Date: 08/19/2020 CLINICAL DATA:  Right-sided abdominal pain EXAM: ULTRASOUND ABDOMEN LIMITED RIGHT UPPER QUADRANT COMPARISON:  CT from earlier in the same day. FINDINGS: Gallbladder: Well distended with multiple gallstones within. Positive sonographic Murphy's sign is noted. No wall thickening or pericholecystic fluid is noted. Common bile duct: Diameter: 3 mm. Liver: Increased echogenicity is noted consistent with fatty infiltration. Portal vein is patent on color Doppler imaging with normal direction of blood flow towards the liver. Other: None. IMPRESSION: Cholelithiasis with positive sonographic Murphy's sign. No wall thickening or pericholecystic fluid is noted. Fatty liver. Electronically Signed   By: Alcide Clever M.D.   On: 08/19/2020 15:29    Anti-infectives: Anti-infectives (From admission, onward)   None       Assessment/Plan Uncontrolled T2DM - blood glucose improving, A1c 9 HTN Hx of alcohol use  Gallstone pancreatitis  - lipase 162 on admit, 31 this AM; LFTs otherwise normal  - less ttp today - ok to proceed to OR for lap chole  - discussed procedure with patient and reviewed risks and benefits and he is agreeable to proceed   FEN: NPO VTE: lovenox ID: [id number] pre-op  LOS: 2 days    Juliet Rude, [facility] 08/21/2020, 10:04 AM Please see Amion for pager number during day hours 7:00am-4:30pm

## 2020-08-22 ENCOUNTER — Inpatient Hospital Stay (HOSPITAL_COMMUNITY): Payer: 59 | Admitting: Certified Registered Nurse Anesthetist

## 2020-08-22 ENCOUNTER — Encounter (HOSPITAL_COMMUNITY)
Admission: EM | Disposition: A | Discharge status: Home / Self Care | Payer: Self-pay | Source: Home / Self Care | Attending: Internal Medicine

## 2020-08-22 ENCOUNTER — Encounter (HOSPITAL_COMMUNITY): Payer: Self-pay | Admitting: Internal Medicine

## 2020-08-22 DIAGNOSIS — E1165 Type 2 diabetes mellitus with hyperglycemia: Secondary | ICD-10-CM | POA: Diagnosis not present

## 2020-08-22 DIAGNOSIS — K851 Biliary acute pancreatitis without necrosis or infection: Secondary | ICD-10-CM | POA: Diagnosis not present

## 2020-08-22 DIAGNOSIS — E876 Hypokalemia: Secondary | ICD-10-CM | POA: Diagnosis not present

## 2020-08-22 DIAGNOSIS — I10 Essential (primary) hypertension: Secondary | ICD-10-CM | POA: Diagnosis not present

## 2020-08-22 HISTORY — PX: CHOLECYSTECTOMY: SHX55

## 2020-08-22 LAB — COMPREHENSIVE METABOLIC PANEL
ALT: 60 U/L — ABNORMAL HIGH (ref 0–44)
AST: 45 U/L — ABNORMAL HIGH (ref 15–41)
Albumin: 3.9 g/dL (ref 3.5–5.0)
Alkaline Phosphatase: 51 U/L (ref 38–126)
Anion gap: 4 — ABNORMAL LOW (ref 5–15)
BUN: 11 mg/dL (ref 6–20)
CO2: 27 mmol/L (ref 22–32)
Calcium: 8.7 mg/dL — ABNORMAL LOW (ref 8.9–10.3)
Chloride: 106 mmol/L (ref 98–111)
Creatinine, Ser: 0.99 mg/dL (ref 0.61–1.24)
GFR, Estimated: >60 mL/min (ref >60)
Glucose, Bld: 135 mg/dL — ABNORMAL HIGH (ref 70–99)
Potassium: 4.1 mmol/L (ref 3.5–5.1)
Sodium: 137 mmol/L (ref 135–145)
Total Bilirubin: 0.9 mg/dL (ref 0.3–1.2)
Total Protein: 6.3 g/dL — ABNORMAL LOW (ref 6.5–8.1)

## 2020-08-22 LAB — CBC WITH DIFFERENTIAL/PLATELET
Abs Immature Granulocytes: 0.01 10*3/uL (ref 0.00–0.07)
Basophils Absolute: 0 10*3/uL (ref 0.0–0.1)
Basophils Relative: 1 %
Eosinophils Absolute: 0.1 10*3/uL (ref 0.0–0.5)
Eosinophils Relative: 3 %
HCT: 34.8 % — ABNORMAL LOW (ref 39.0–52.0)
Hemoglobin: 11.7 g/dL — ABNORMAL LOW (ref 13.0–17.0)
Immature Granulocytes: 0 %
Lymphocytes Relative: 63 %
Lymphs Abs: 2.6 10*3/uL (ref 0.7–4.0)
MCH: 31 pg (ref 26.0–34.0)
MCHC: 33.6 g/dL (ref 30.0–36.0)
MCV: 92.1 fL (ref 80.0–100.0)
Monocytes Absolute: 0.3 10*3/uL (ref 0.1–1.0)
Monocytes Relative: 7 %
Neutro Abs: 1.1 10*3/uL — ABNORMAL LOW (ref 1.7–7.7)
Neutrophils Relative %: 26 %
Platelets: 173 10*3/uL (ref 150–400)
RBC: 3.78 MIL/uL — ABNORMAL LOW (ref 4.22–5.81)
RDW: 12.3 % (ref 11.5–15.5)
WBC: 4.2 10*3/uL (ref 4.0–10.5)
nRBC: 0 % (ref 0.0–0.2)

## 2020-08-22 LAB — GLUCOSE, CAPILLARY
Glucose-Capillary: 126 mg/dL — ABNORMAL HIGH (ref 70–99)
Glucose-Capillary: 164 mg/dL — ABNORMAL HIGH (ref 70–99)
Glucose-Capillary: 148 mg/dL — ABNORMAL HIGH (ref 70–99)
Glucose-Capillary: 120 mg/dL — ABNORMAL HIGH (ref 70–99)
Glucose-Capillary: 169 mg/dL — ABNORMAL HIGH (ref 70–99)
Glucose-Capillary: 151 mg/dL — ABNORMAL HIGH (ref 70–99)
Glucose-Capillary: 228 mg/dL — ABNORMAL HIGH (ref 70–99)

## 2020-08-22 LAB — LIPASE, BLOOD: Lipase: 46 U/L (ref 11–51)

## 2020-08-22 LAB — OCCULT BLOOD X 1 CARD TO LAB, STOOL: Fecal Occult Bld: NEGATIVE

## 2020-08-22 LAB — MAGNESIUM: Magnesium: 2 mg/dL (ref 1.7–2.4)

## 2020-08-22 SURGERY — LAPAROSCOPIC CHOLECYSTECTOMY
Anesthesia: General | Site: Abdomen

## 2020-08-22 MED ORDER — LIDOCAINE 2% (20 MG/ML) 5 ML SYRINGE
INTRAMUSCULAR | Status: DC | PRN
  Administered 2020-08-22: 100 mg via INTRAVENOUS

## 2020-08-22 MED ORDER — PROPOFOL 10 MG/ML IV BOLUS
INTRAVENOUS | Status: DC | PRN
  Administered 2020-08-22: 180 mg via INTRAVENOUS
  Administered 2020-08-22: 30 mg via INTRAVENOUS

## 2020-08-22 MED ORDER — LACTATED RINGERS IR SOLN
Status: DC | PRN
  Administered 2020-08-22: 1000 mL

## 2020-08-22 MED ORDER — PROPOFOL 10 MG/ML IV BOLUS
INTRAVENOUS | Status: AC
  Filled 2020-08-22: qty 20

## 2020-08-22 MED ORDER — FENTANYL CITRATE (PF) 100 MCG/2ML IJ SOLN
INTRAMUSCULAR | Status: AC
  Filled 2020-08-22: qty 2

## 2020-08-22 MED ORDER — FENTANYL CITRATE (PF) 100 MCG/2ML IJ SOLN
25.0000 ug | INTRAMUSCULAR | Status: DC | PRN
  Administered 2020-08-22 (×3): 50 ug via INTRAVENOUS

## 2020-08-22 MED ORDER — CIPROFLOXACIN IN D5W 400 MG/200ML IV SOLN
400.0000 mg | Freq: Once | INTRAVENOUS | Status: AC
  Administered 2020-08-22: 400 mg via INTRAVENOUS
  Filled 2020-08-22: qty 200

## 2020-08-22 MED ORDER — ROCURONIUM BROMIDE 10 MG/ML (PF) SYRINGE
PREFILLED_SYRINGE | INTRAVENOUS | Status: DC | PRN
  Administered 2020-08-22: 20 mg via INTRAVENOUS
  Administered 2020-08-22: 60 mg via INTRAVENOUS

## 2020-08-22 MED ORDER — LACTATED RINGERS IV SOLN: INTRAVENOUS | Status: DC | PRN

## 2020-08-22 MED ORDER — KETOROLAC TROMETHAMINE 15 MG/ML IJ SOLN
INTRAMUSCULAR | Status: DC | PRN
  Administered 2020-08-22: 15 mg via INTRAVENOUS

## 2020-08-22 MED ORDER — LACTATED RINGERS IV SOLN: INTRAVENOUS | Status: DC

## 2020-08-22 MED ORDER — ONDANSETRON HCL 4 MG/2ML IJ SOLN
INTRAMUSCULAR | Status: DC | PRN
  Administered 2020-08-22: 4 mg via INTRAVENOUS

## 2020-08-22 MED ORDER — ACETAMINOPHEN 500 MG PO TABS
1000.0000 mg | ORAL_TABLET | Freq: Once | ORAL | Status: AC
  Administered 2020-08-22: 1000 mg via ORAL
  Filled 2020-08-22: qty 2

## 2020-08-22 MED ORDER — SUGAMMADEX SODIUM 200 MG/2ML IV SOLN
INTRAVENOUS | Status: DC | PRN
  Administered 2020-08-22: 200 mg via INTRAVENOUS

## 2020-08-22 MED ORDER — DEXAMETHASONE SODIUM PHOSPHATE 10 MG/ML IJ SOLN
INTRAMUSCULAR | Status: AC
  Filled 2020-08-22: qty 1

## 2020-08-22 MED ORDER — LIDOCAINE 2% (20 MG/ML) 5 ML SYRINGE
INTRAMUSCULAR | Status: AC
  Filled 2020-08-22: qty 5

## 2020-08-22 MED ORDER — MIDAZOLAM HCL 2 MG/2ML IJ SOLN
INTRAMUSCULAR | Status: AC
  Filled 2020-08-22: qty 2

## 2020-08-22 MED ORDER — SODIUM CHLORIDE 0.45 % IV SOLN: INTRAVENOUS | Status: DC

## 2020-08-22 MED ORDER — BUPIVACAINE-EPINEPHRINE (PF) 0.25% -1:200000 IJ SOLN
INTRAMUSCULAR | Status: DC | PRN
Start: 2020-08-22 — End: 2020-08-22
  Administered 2020-08-22: 4.5 mL

## 2020-08-22 MED ORDER — BUPIVACAINE-EPINEPHRINE (PF) 0.25% -1:200000 IJ SOLN
INTRAMUSCULAR | Status: AC
  Filled 2020-08-22: qty 30

## 2020-08-22 MED ORDER — TRAMADOL HCL 50 MG PO TABS: 100.0000 mg | ORAL_TABLET | Freq: Four times a day (QID) | ORAL | Status: DC | PRN

## 2020-08-22 MED ORDER — DEXAMETHASONE SODIUM PHOSPHATE 10 MG/ML IJ SOLN
INTRAMUSCULAR | Status: DC | PRN
  Administered 2020-08-22: 8 mg via INTRAVENOUS

## 2020-08-22 MED ORDER — ROCURONIUM BROMIDE 10 MG/ML (PF) SYRINGE
PREFILLED_SYRINGE | INTRAVENOUS | Status: AC
  Filled 2020-08-22: qty 10

## 2020-08-22 MED ORDER — LIDOCAINE-EPINEPHRINE (PF) 1 %-1:200000 IJ SOLN
INTRAMUSCULAR | Status: AC
  Filled 2020-08-22: qty 30

## 2020-08-22 MED ORDER — DEXMEDETOMIDINE (PRECEDEX) IN NS 20 MCG/5ML (4 MCG/ML) IV SYRINGE
PREFILLED_SYRINGE | INTRAVENOUS | Status: DC | PRN
  Administered 2020-08-22: 8 ug via INTRAVENOUS
  Administered 2020-08-22: 12 ug via INTRAVENOUS

## 2020-08-22 MED ORDER — MIDAZOLAM HCL 5 MG/5ML IJ SOLN
INTRAMUSCULAR | Status: DC | PRN
  Administered 2020-08-22: 2 mg via INTRAVENOUS

## 2020-08-22 MED ORDER — LIDOCAINE HCL (PF) 1 % IJ SOLN
INTRAMUSCULAR | Status: DC | PRN
  Administered 2020-08-22: 4.5 mL

## 2020-08-22 MED ORDER — DEXMEDETOMIDINE (PRECEDEX) IN NS 20 MCG/5ML (4 MCG/ML) IV SYRINGE
PREFILLED_SYRINGE | INTRAVENOUS | Status: AC
  Filled 2020-08-22: qty 5

## 2020-08-22 MED ORDER — OXYCODONE HCL 5 MG PO TABS
5.0000 mg | ORAL_TABLET | ORAL | Status: DC | PRN
  Administered 2020-08-22 (×2): 5 mg via ORAL
  Administered 2020-08-23 (×2): 10 mg via ORAL
  Filled 2020-08-22 (×2): qty 1
  Filled 2020-08-22 (×2): qty 2

## 2020-08-22 MED ORDER — ONDANSETRON HCL 4 MG/2ML IJ SOLN
INTRAMUSCULAR | Status: AC
  Filled 2020-08-22: qty 2

## 2020-08-22 MED ORDER — FENTANYL CITRATE (PF) 100 MCG/2ML IJ SOLN
INTRAMUSCULAR | Status: DC | PRN
  Administered 2020-08-22: 50 ug via INTRAVENOUS
  Administered 2020-08-22: 100 ug via INTRAVENOUS
  Administered 2020-08-22: 50 ug via INTRAVENOUS

## 2020-08-22 SURGICAL SUPPLY — 40 items
APPLIER CLIP ROT 10 11.4 M/L (STAPLE) ×3
CHLORAPREP W/TINT 26 (MISCELLANEOUS) ×3 IMPLANT
CLIP APPLIE ROT 10 11.4 M/L (STAPLE) ×2 IMPLANT
CLIP VESOLOCK MED LG 6/CT (CLIP) IMPLANT
COVER MAYO STAND STRL (DRAPES) IMPLANT
COVER SURGICAL LIGHT HANDLE (MISCELLANEOUS) ×3 IMPLANT
COVER WAND RF STERILE (DRAPES) IMPLANT
DECANTER SPIKE VIAL GLASS SM (MISCELLANEOUS) ×3 IMPLANT
DERMABOND ADVANCED (GAUZE/BANDAGES/DRESSINGS)
DERMABOND ADVANCED .7 DNX12 (GAUZE/BANDAGES/DRESSINGS) IMPLANT
DRAPE C-ARM 42X120 X-RAY (DRAPES) IMPLANT
ELECT L-HOOK LAP 45CM DISP (ELECTROSURGICAL) ×3
ELECT REM PT RETURN 15FT ADLT (MISCELLANEOUS) ×3 IMPLANT
ELECTRODE L-HOOK LAP 45CM DISP (ELECTROSURGICAL) IMPLANT
GLOVE SURG ENC MOIS LTX SZ6 (GLOVE) ×3 IMPLANT
GLOVE SURG UNDER LTX SZ6.5 (GLOVE) ×3 IMPLANT
GOWN STRL REUS W/TWL 2XL LVL3 (GOWN DISPOSABLE) ×3 IMPLANT
GOWN STRL REUS W/TWL XL LVL3 (GOWN DISPOSABLE) ×6 IMPLANT
HEMOSTAT SNOW SURGICEL 2X4 (HEMOSTASIS) IMPLANT
KIT BASIN OR (CUSTOM PROCEDURE TRAY) ×3 IMPLANT
KIT TURNOVER KIT A (KITS) ×3 IMPLANT
L-HOOK LAP DISP 36CM (ELECTROSURGICAL)
LHOOK LAP DISP 36CM (ELECTROSURGICAL) IMPLANT
PAD POSITIONING PINK XL (MISCELLANEOUS) IMPLANT
PENCIL SMOKE EVACUATOR (MISCELLANEOUS) IMPLANT
POUCH SPECIMEN RETRIEVAL 10MM (ENDOMECHANICALS) IMPLANT
PROTECTOR NERVE ULNAR (MISCELLANEOUS) IMPLANT
SCISSORS LAP 5X35 DISP (ENDOMECHANICALS) ×3 IMPLANT
SET CHOLANGIOGRAPH MIX (MISCELLANEOUS) IMPLANT
SET IRRIG TUBING LAPAROSCOPIC (IRRIGATION / IRRIGATOR) ×3 IMPLANT
SET TUBE SMOKE EVAC HIGH FLOW (TUBING) IMPLANT
SLEEVE XCEL OPT CAN 5 100 (ENDOMECHANICALS) ×3 IMPLANT
SUT MNCRL AB 4-0 PS2 18 (SUTURE) ×3 IMPLANT
TAPE CLOTH 4X10 WHT NS (GAUZE/BANDAGES/DRESSINGS) IMPLANT
TOWEL OR 17X26 10 PK STRL BLUE (TOWEL DISPOSABLE) ×3 IMPLANT
TOWEL OR NON WOVEN STRL DISP B (DISPOSABLE) ×2 IMPLANT
TRAY LAPAROSCOPIC (CUSTOM PROCEDURE TRAY) ×3 IMPLANT
TROCAR BLADELESS OPT 5 100 (ENDOMECHANICALS) ×3 IMPLANT
TROCAR XCEL BLUNT TIP 100MML (ENDOMECHANICALS) ×3 IMPLANT
TROCAR XCEL NON-BLD 11X100MML (ENDOMECHANICALS) ×3 IMPLANT

## 2020-08-22 NOTE — Anesthesia Postprocedure Evaluation (Signed)
Anesthesia Post Note  Patient: Edward Wise  Procedure(s) Performed: LAPAROSCOPIC CHOLECYSTECTOMY (N/A Abdomen)     Patient location during evaluation: PACU Anesthesia Type: General Level of consciousness: awake and alert Pain management: pain level controlled Vital Signs Assessment: post-procedure vital signs reviewed and stable Respiratory status: spontaneous breathing, nonlabored ventilation, respiratory function stable and patient connected to nasal cannula oxygen Cardiovascular status: blood pressure returned to baseline and stable Postop Assessment: no apparent nausea or vomiting Anesthetic complications: no   No complications documented.  Last Vitals:  Vitals:   08/22/20 1345 08/22/20 1400  BP: (!) 168/84 (!) 111/96  Pulse: 75 76  Resp: 18 16  Temp:    SpO2: 97% 100%    Last Pain:  Vitals:   08/22/20 1405  TempSrc:   PainSc: 8                  Jelina Paulsen S

## 2020-08-22 NOTE — Progress Notes (Signed)
PROGRESS NOTE    Edward Wise  PIR:[id number] DOB: [birth year] DOA: 08/19/2020 PCP: Pcp, No    Chief Complaint  Patient presents with  . Emesis  . Abdominal Pain    Brief Narrative:  Edward Wise a 50 y.o.malewith medical history significant fortype 2 diabetes mellitus and hypertension who presents to the ER for evaluation of abdominal pain which he has had for 3 days. Pain is mostly in the epigastrium/periumbilical area as well as right upper quadrant and has been constant for the last 3 days. He rated his pain a 7 x 10 in intensity at its worst associated with nausea and multiple episodes of emesis. CT scan of abdomen and pelvis showsno acute findings in the abdomen or pelvis. Specifically, no CT evidence of acute pancreatitis or appendicitis.Mild hepatic steatosis. Aortic atherosclerosis. Gallbladder ultrasound showedcholelithiasis with positive sonographic Murphy's sign. No wall thickening or pericholecystic fluid is noted. Fatty liver.  Hospitalist called to admit, surgery consulted for further evaluation for lap chole.   Assessment & Plan:   Principal Problem:   Acute gallstone pancreatitis Active Problems:   Diabetes mellitus with hyperglycemia (HCC)   Hypertension   Hypokalemia  1 acute gallstone pancreatitis with intractable nausea vomiting abdominal pain, POA -Patient had presented with abdominal pain mostly in the epigastric/periumbilical region, right upper quadrant pain. -CT abdomen and pelvis done on presentation with no acute findings in the abdomen or pelvis, specifically no CT evidence of acute pancreatitis or appendicitis, mild hepatic steatosis, aortic atherosclerosis. -Lipase noted to be elevated at 162 on presentation with trended down currently at 46. -Right upper quadrant ultrasound done with cholelithiasis, positive sonographic Murphy sign, no wall thickening or pericholecystic fluid noted, fatty liver. -Clinical improvement.   -Tolerated clears.    -Status post laparoscopic cholecystectomy this afternoon.   -General surgery following.   -Supportive care.   2.  Uncontrolled diabetes mellitus type 2 -Hemoglobin A1c 9.7 (08/19/2020) -148 this morning.   -Continue to hold oral hypoglycemic agents.   -SSI.  3. hypertension -Continue Cozaar.   -Continue to hold HCTZ.   4.  Hypokalemia -Repleted.  Potassium of 4.1.   -Repeat labs in the morning.    DVT prophylaxis: Lovenox Code Status: Full Family Communication: Updated patient and fianc at bedside.  Disposition:   Status is: Inpatient    Dispo: The patient is from: Home              Anticipated d/c is to: Home              Patient currently with a gallstone pancreatitis, status post laparoscopic cholecystectomy this afternoon.  Currently NPO.  Not stable for discharge.    Difficult to place patient: No       Consultants:   General surgery: Dr. Donell Beers 08/19/2020  Procedures:   CT abdomen and pelvis 08/19/2020  Right upper quadrant ultrasound 08/19/2020  Laparoscopic cholecystectomy per Dr. Donell Beers 08/22/2020  Antimicrobials:   None   Subjective: Patient just returning from laparoscopic cholecystectomy.  Denies any chest pain.  No shortness of breath.  States abdominal pain was improving prior to surgery this morning.  Complaining of some abdominal discomfort around surgical/incision sites.  Tolerated clears yesterday.  Stools are becoming more brown in color.  Overall slowly improving since admission.   .  Objective: Vitals:   08/22/20 1430 08/22/20 1445 08/22/20 1500 08/22/20 1525  BP: (!) 151/80 (!) 150/85 139/89 (!) 169/101  Pulse: 61 62 63 68  Resp: 10 12 11 14   Temp: 97.7  F (36.5 C)   98.1 F (36.7 C)  TempSrc:      SpO2: 100% 100% 100% 100%  Weight:      Height:        Intake/Output Summary (Last 24 hours) at 08/22/2020 1526 Last data filed at 08/22/2020 1400 Gross per 24 hour  Intake 2224.43 ml  Output 1575 ml  Net 649.43 ml   Filed  Weights   08/19/20 1020  Weight: 97.5 kg    Examination:  General exam: : NAD Respiratory system: CTA B bilaterally, no wheezes, no crackles, no rhonchi.  Normal respiratory effort.  Speaking in full sentences.  Cardiovascular system: Regular rate and rhythm no murmurs rubs or gallops.  No JVD.  No lower extremity edema.  Gastrointestinal system: Abdomen soft, some diffuse tenderness to palpation, incision sites C/D/I.  Positive bowel sounds.  No rebound.  No guarding.   Central nervous system: Alert and oriented. No focal neurological deficits. Extremities: Symmetric 5 x 5 power. Skin: No rashes, lesions or ulcers Psychiatry: Judgement and insight appear normal. Mood & affect appropriate.    Data Reviewed: I have personally reviewed following labs and imaging studies  CBC: Recent Labs  Lab 08/19/20 1130 08/20/20 0429 08/21/20 0435 08/21/20 2028 08/22/20 0418  WBC 6.9 6.5 4.7  --  4.2  NEUTROABS  --   --   --   --  1.1*  HGB 16.0 13.7 12.9* 12.3* 11.7*  HCT 46.0 40.8 39.0 36.3* 34.8*  MCV 89.5 92.3 91.8  --  92.1  PLT 247 220 173  --  173    Basic Metabolic Panel: Recent Labs  Lab 08/19/20 1130 08/20/20 0429 08/21/20 0435 08/22/20 0418  NA 132* 137 135 137  K 4.1 3.5 3.3* 4.1  CL 93* 101 102 106  CO2 23 26 26 27   GLUCOSE 430* 173* 175* 135*  BUN 27* 22* 15 11  CREATININE 1.23 0.96 1.08 0.99  CALCIUM 10.0 9.1 8.7* 8.7*  MG  --   --  2.0 2.0    GFR: Estimated Creatinine Clearance: 104.9 mL/min (by C-G formula based on SCr of 0.99 mg/dL).  Liver Function Tests: Recent Labs  Lab 08/19/20 1130 08/21/20 0435 08/22/20 0418  AST 21 16 45*  ALT 26 21 60*  ALKPHOS 81 55 51  BILITOT 1.2 1.2 0.9  PROT 9.0* 6.6 6.3*  ALBUMIN 5.2* 4.2 3.9    CBG: Recent Labs  Lab 08/22/20 0400 08/22/20 0751 08/22/20 0915 08/22/20 1103 08/22/20 1340  GLUCAP 126* 164* 148* 120* 169*     Recent Results (from the past 240 hour(s))  Resp Panel by RT-PCR (Flu A&B,  Covid) Nasopharyngeal Swab     Status: None   Collection Time: 08/19/20  4:53 PM   Specimen: Nasopharyngeal Swab; Nasopharyngeal(NP) swabs in vial transport medium  Result Value Ref Range Status   SARS Coronavirus 2 by RT PCR NEGATIVE NEGATIVE Final    Comment: (NOTE) SARS-CoV-2 target nucleic acids are NOT DETECTED.  The SARS-CoV-2 RNA is generally detectable in upper respiratory specimens during the acute phase of infection. The lowest concentration of SARS-CoV-2 viral copies this assay can detect is 138 copies/mL. A negative result does not preclude SARS-Cov-2 infection and should not be used as the sole basis for treatment or other patient management decisions. A negative result may occur with  improper specimen collection/handling, submission of specimen other than nasopharyngeal swab, presence of viral mutation(s) within the areas targeted by this assay, and inadequate number of viral copies(<138 copies/mL).  A negative result must be combined with clinical observations, patient history, and epidemiological information. The expected result is Negative.  Fact Sheet for Patients:  BloggerCourse.com  Fact Sheet for Healthcare Providers:  SeriousBroker.it  This test is no t yet approved or cleared by the [place] FDA and  has been authorized for detection and/or diagnosis of SARS-CoV-2 by FDA under an Emergency Use Authorization (EUA). This EUA will remain  in effect (meaning this test can be used) for the duration of the COVID-19 declaration under Section 564(b)(1) of the Act, 21 U.S.C.section 360bbb-3(b)(1), unless the authorization is terminated  or revoked sooner.       Influenza A by PCR NEGATIVE NEGATIVE Final   Influenza B by PCR NEGATIVE NEGATIVE Final    Comment: (NOTE) The Xpert Xpress SARS-CoV-2/FLU/RSV plus assay is intended as an aid in the diagnosis of influenza from Nasopharyngeal swab specimens and should  not be used as a sole basis for treatment. Nasal washings and aspirates are unacceptable for Xpert Xpress SARS-CoV-2/FLU/RSV testing.  Fact Sheet for Patients: BloggerCourse.com  Fact Sheet for Healthcare Providers: SeriousBroker.it  This test is not yet approved or cleared by the [place] FDA and has been authorized for detection and/or diagnosis of SARS-CoV-2 by FDA under an Emergency Use Authorization (EUA). This EUA will remain in effect (meaning this test can be used) for the duration of the COVID-19 declaration under Section 564(b)(1) of the Act, 21 U.S.C. section 360bbb-3(b)(1), unless the authorization is terminated or revoked.  Performed at [facility], [address] [place], [place] [address]   Surgical pcr screen     Status: None   Collection Time: 08/21/20  3:54 AM   Specimen: Nasal Mucosa; Nasal Swab  Result Value Ref Range Status   MRSA, PCR NEGATIVE NEGATIVE Final   Staphylococcus aureus NEGATIVE NEGATIVE Final    Comment: (NOTE) The Xpert SA Assay (FDA approved for NASAL specimens in patients 26 years of age and older), is one component of a comprehensive surveillance program. It is not intended to diagnose infection nor to guide or monitor treatment. Performed at [facility], [address] [place], [place] [address]          Radiology Studies: No results found.      Scheduled Meds: . DULoxetine  60 mg Oral Daily  . enoxaparin (LOVENOX) injection  40 mg Subcutaneous Daily  . fentaNYL      . fentaNYL      . insulin aspart  0-15 Units Subcutaneous Q4H  . losartan  50 mg Oral Daily  . pantoprazole (PROTONIX) IV  40 mg Intravenous Q12H   Continuous Infusions: . sodium chloride 150 mL/hr at 08/21/20 2227     LOS: 3 days    Time spent: 35 minutes    Ramiro Harvest, MD [facility]   To contact the attending provider between 7A-7P or  the covering provider during after hours 7P-7A, please log into the web site [email or web address] and access using universal [place] password for that web site. If you do not have the password, please call the hospital operator.  08/22/2020, 3:26 PM

## 2020-08-22 NOTE — Transfer of Care (Signed)
Immediate Anesthesia Transfer of Care Note  Patient: Edward Wise  Procedure(s) Performed: LAPAROSCOPIC CHOLECYSTECTOMY (N/A Abdomen)  Patient Location: PACU  Anesthesia Type:General  Level of Consciousness: awake and patient cooperative  Airway & Oxygen Therapy: Patient Spontanous Breathing and Patient connected to face mask oxygen  Post-op Assessment: Report given to RN and Post -op Vital signs reviewed and stable  Post vital signs: Reviewed and stable  Last Vitals:  Vitals Value Taken Time  BP 168/84 08/22/20 1345  Temp 36.6 C 08/22/20 1339  Pulse 72 08/22/20 1346  Resp 17 08/22/20 1346  SpO2 98 % 08/22/20 1346  Vitals shown include unvalidated device data.  Last Pain:  Vitals:   08/22/20 1339  TempSrc:   PainSc: Asleep      Patients Stated Pain Goal: 2 (08/22/20 0218)  Complications: No complications documented.

## 2020-08-22 NOTE — Op Note (Signed)
Laparoscopic Cholecystectomy  Indications: This patient presents with gallstones and pancreatitis and will undergo laparoscopic cholecystectomy.  Pre-operative Diagnosis: biliary pancreatitis  Post-operative Diagnosis: Same  Surgeon: [name]   Assistants: Myrtie Soman, RNFA  Anesthesia: General endotracheal anesthesia and local  ASA Class: 2  Procedure Details  The patient was seen again in the Holding Room. The risks, benefits, complications, treatment options, and expected outcomes were discussed with the patient. The possibilities of  bleeding, recurrent infection, damage to nearby structures, the need for additional procedures, failure to diagnose a condition, the possible need to convert to an open procedure, and creating a complication requiring transfusion or operation were discussed with the patient. The likelihood of improving the patient's symptoms with return to their baseline status is good.    The patient and/or family concurred with the proposed plan, giving informed consent. The site of surgery properly noted. The patient was taken to Operating Room, and the procedure verified as Laparoscopic Cholecystectomy with Intraoperative Cholangiogram. A Time Out was held and the above information confirmed.  Prior to the induction of general anesthesia, antibiotic prophylaxis was administered. General endotracheal anesthesia was then administered and tolerated well. After the induction, the abdomen was prepped with Chloraprep and draped in the sterile fashion. The patient was positioned in the supine position.  Local anesthetic agent was injected into the skin near the umbilicus and a vertical infraumbilical incision was made with a #11 blade. I dissected down to the abdominal fascia with blunt dissection.  The fascia was incised vertically and we entered the peritoneal cavity bluntly.  A pursestring suture of 0-Vicryl was placed around the fascial opening.  The Hasson cannula  was inserted and secured with the stay suture.  Pneumoperitoneum was then created with CO2 and tolerated well without any adverse changes in the patient's vital signs. An 11-mm port was placed in the subxiphoid position.  Two 5-mm ports were placed in the right upper quadrant. All skin incisions were infiltrated with a local anesthetic agent before making the incision and placing the trocars.   We positioned the patient in reverse Trendelenburg, tilted slightly to the patient's left.  The gallbladder was identified, the fundus grasped and retracted cephalad. Adhesions were lysed bluntly and with the electrocautery where indicated, taking care not to injure any adjacent organs or viscus. The infundibulum was grasped and retracted laterally, exposing the peritoneum overlying the triangle of Calot. This was then divided and exposed in a blunt fashion. The cystic duct and cystic artery were clearly identified and bluntly dissected circumferentially.   A critical view of the cystic duct and cystic artery was obtained.  The cystic duct was very narrow in caliber and short. Decision was made not to do cholangiogram.    The cystic duct was ligated with a clip distally and four clips proximally. The cystic artery was ligated with two clips proximally and one clip distally.  The gallbladder was dissected from the liver bed in retrograde fashion with the electrocautery. A posterior branch of the artery was encountered and clipped. The gallbladder was removed and placed in an Endocatch bag.  The gallbladder and Endocatch bag were then removed through the umbilical port site.  The liver bed was irrigated and inspected. Hemostasis was achieved with the electrocautery. Copious irrigation was utilized and was repeatedly aspirated until clear.    We again inspected the right upper quadrant for hemostasis.  Pneumoperitoneum was released as we removed the trocars.   The pursestring suture was used to close the umbilical  fascia.   There was no residual palpable fascial defect.  4-0 Monocryl was used to close the skin.   The skin was cleaned and dry, and Dermabond was applied. The patient was then extubated and brought to the recovery room in stable condition. Instrument, sponge, and needle counts were correct at closure and at the conclusion of the case.   Findings: Normal appearing liver. Mild inflammation of gallbladder wall.  Small nubbin of liver on the surface of the gallbladder.    Estimated Blood Loss: min         Drains: none          Specimens: Gallbladder to pathology       Complications: None; patient tolerated the procedure well.         Disposition: PACU - hemodynamically stable.         Condition: stable

## 2020-08-22 NOTE — Anesthesia Procedure Notes (Signed)
Procedure Name: Intubation Performed by: West Pugh, CRNA Pre-anesthesia Checklist: Patient identified, Emergency Drugs available, Suction available, Patient being monitored and Timeout performed Patient Re-evaluated:Patient Re-evaluated prior to induction Oxygen Delivery Method: Circle system utilized Preoxygenation: Pre-oxygenation with 100% oxygen Induction Type: IV induction Ventilation: Mask ventilation without difficulty Laryngoscope Size: Mac and 3 Grade View: Grade II Tube type: Oral Tube size: 7.5 mm Number of attempts: 1 Airway Equipment and Method: Stylet Placement Confirmation: ETT inserted through vocal cords under direct vision,  positive ETCO2,  CO2 detector and breath sounds checked- equal and bilateral Secured at: 22 cm Tube secured with: Tape Dental Injury: Teeth and Oropharynx as per pre-operative assessment

## 2020-08-22 NOTE — Anesthesia Preprocedure Evaluation (Addendum)
Anesthesia Evaluation  Patient identified by MRN, date of birth, ID band Patient awake    Reviewed: Allergy & Precautions, NPO status , Patient's Chart, lab work & pertinent test results  Airway Mallampati: II  TM Distance: >3 FB Neck ROM: Full    Dental  (+) Chipped, Dental Advisory Given,    Pulmonary neg pulmonary ROS,    Pulmonary exam normal breath sounds clear to auscultation       Cardiovascular hypertension, Pt. on medications Normal cardiovascular exam Rhythm:Regular Rate:Normal     Neuro/Psych negative neurological ROS  negative psych ROS   GI/Hepatic Neg liver ROS, GERD  Medicated and Controlled,  Endo/Other  diabetes, Type 2, Oral Hypoglycemic Agents  Renal/GU negative Renal ROS  negative genitourinary   Musculoskeletal negative musculoskeletal ROS (+)   Abdominal   Peds  Hematology negative hematology ROS (+)   Anesthesia Other Findings   Reproductive/Obstetrics                            Anesthesia Physical Anesthesia Plan  ASA: II  Anesthesia Plan: General   Post-op Pain Management:    Induction: Intravenous  PONV Risk Score and Plan: 2 and Midazolam, Dexamethasone and Ondansetron  Airway Management Planned: Oral ETT  Additional Equipment:   Intra-op Plan:   Post-operative Plan: Extubation in OR  Informed Consent: I have reviewed the patients History and Physical, chart, labs and discussed the procedure including the risks, benefits and alternatives for the proposed anesthesia with the patient or authorized representative who has indicated his/her understanding and acceptance.     Dental advisory given  Plan Discussed with: CRNA  Anesthesia Plan Comments:         Anesthesia Quick Evaluation

## 2020-08-22 NOTE — Interval H&P Note (Signed)
History and Physical Interval Note:  08/22/2020 10:27 AM  Edward Wise  has presented today for surgery, with the diagnosis of BILIARY PANCREATITIS.  The various methods of treatment have been discussed with the patient and family. After consideration of risks, benefits and other options for treatment, the patient has consented to  Procedure(s): LAPAROSCOPIC CHOLECYSTECTOMY (N/A) INTRAOPERATIVE CHOLANGIOGRAM (N/A) as a surgical intervention.  The patient's history has been reviewed, patient examined, no change in status, stable for surgery.  I have reviewed the patient's chart and labs.  Questions were answered to the patient's satisfaction.     [name]

## 2020-08-23 ENCOUNTER — Encounter (HOSPITAL_COMMUNITY): Payer: Self-pay | Admitting: General Surgery

## 2020-08-23 DIAGNOSIS — K8020 Calculus of gallbladder without cholecystitis without obstruction: Secondary | ICD-10-CM | POA: Diagnosis not present

## 2020-08-23 DIAGNOSIS — I10 Essential (primary) hypertension: Secondary | ICD-10-CM | POA: Diagnosis not present

## 2020-08-23 DIAGNOSIS — K851 Biliary acute pancreatitis without necrosis or infection: Secondary | ICD-10-CM | POA: Diagnosis not present

## 2020-08-23 DIAGNOSIS — Z9049 Acquired absence of other specified parts of digestive tract: Secondary | ICD-10-CM

## 2020-08-23 DIAGNOSIS — E1165 Type 2 diabetes mellitus with hyperglycemia: Secondary | ICD-10-CM | POA: Diagnosis not present

## 2020-08-23 LAB — COMPREHENSIVE METABOLIC PANEL
ALT: 86 U/L — ABNORMAL HIGH (ref 0–44)
AST: 66 U/L — ABNORMAL HIGH (ref 15–41)
Albumin: 4.3 g/dL (ref 3.5–5.0)
Alkaline Phosphatase: 59 U/L (ref 38–126)
Anion gap: 7 (ref 5–15)
BUN: 16 mg/dL (ref 6–20)
CO2: 27 mmol/L (ref 22–32)
Calcium: 9.2 mg/dL (ref 8.9–10.3)
Chloride: 104 mmol/L (ref 98–111)
Creatinine, Ser: 0.98 mg/dL (ref 0.61–1.24)
GFR, Estimated: >60 mL/min (ref >60)
Glucose, Bld: 142 mg/dL — ABNORMAL HIGH (ref 70–99)
Potassium: 4 mmol/L (ref 3.5–5.1)
Sodium: 138 mmol/L (ref 135–145)
Total Bilirubin: 0.7 mg/dL (ref 0.3–1.2)
Total Protein: 6.6 g/dL (ref 6.5–8.1)

## 2020-08-23 LAB — CBC
HCT: 36.5 % — ABNORMAL LOW (ref 39.0–52.0)
Hemoglobin: 12.2 g/dL — ABNORMAL LOW (ref 13.0–17.0)
MCH: 30.8 pg (ref 26.0–34.0)
MCHC: 33.4 g/dL (ref 30.0–36.0)
MCV: 92.2 fL (ref 80.0–100.0)
Platelets: 198 10*3/uL (ref 150–400)
RBC: 3.96 MIL/uL — ABNORMAL LOW (ref 4.22–5.81)
RDW: 12.2 % (ref 11.5–15.5)
WBC: 8.3 10*3/uL (ref 4.0–10.5)
nRBC: 0 % (ref 0.0–0.2)

## 2020-08-23 LAB — GLUCOSE, CAPILLARY
Glucose-Capillary: 138 mg/dL — ABNORMAL HIGH (ref 70–99)
Glucose-Capillary: 146 mg/dL — ABNORMAL HIGH (ref 70–99)
Glucose-Capillary: 191 mg/dL — ABNORMAL HIGH (ref 70–99)

## 2020-08-23 LAB — SURGICAL PATHOLOGY

## 2020-08-23 LAB — MAGNESIUM: Magnesium: 1.9 mg/dL (ref 1.7–2.4)

## 2020-08-23 MED ORDER — ONDANSETRON HCL 4 MG PO TABS: 4.0000 mg | ORAL_TABLET | Freq: Three times a day (TID) | ORAL | 0 refills | Status: DC | PRN

## 2020-08-23 MED ORDER — PANTOPRAZOLE SODIUM 40 MG PO TBEC: 40.0000 mg | DELAYED_RELEASE_TABLET | Freq: Every day | ORAL | 1 refills | Status: DC

## 2020-08-23 MED ORDER — GLIPIZIDE 5 MG PO TABS: 5.0000 mg | ORAL_TABLET | Freq: Every day | ORAL | 1 refills | Status: DC

## 2020-08-23 MED ORDER — LOSARTAN POTASSIUM 50 MG PO TABS: 50.0000 mg | ORAL_TABLET | Freq: Every day | ORAL | 1 refills | Status: AC

## 2020-08-23 MED ORDER — OXYCODONE HCL 5 MG PO TABS: 5.0000 mg | ORAL_TABLET | Freq: Four times a day (QID) | ORAL | 0 refills | Status: DC | PRN

## 2020-08-23 MED ORDER — POLYETHYLENE GLYCOL 3350 17 G PO PACK: 17.0000 g | PACK | Freq: Every day | ORAL | 0 refills | Status: AC | PRN

## 2020-08-23 MED ORDER — POLYETHYLENE GLYCOL 3350 17 G PO PACK: 17.0000 g | PACK | Freq: Every day | ORAL | 0 refills | Status: DC | PRN

## 2020-08-23 MED ORDER — BLOOD GLUCOSE METER KIT: PACK | 0 refills | Status: AC

## 2020-08-23 MED ORDER — ACETAMINOPHEN 325 MG PO TABS: 650.0000 mg | ORAL_TABLET | Freq: Four times a day (QID) | ORAL | Status: DC | PRN

## 2020-08-23 MED ORDER — SENNOSIDES-DOCUSATE SODIUM 8.6-50 MG PO TABS: 1.0000 | ORAL_TABLET | Freq: Two times a day (BID) | ORAL | 0 refills | Status: AC

## 2020-08-23 MED ORDER — BLOOD GLUCOSE METER KIT: PACK | 0 refills | Status: DC

## 2020-08-23 MED ORDER — POTASSIUM CHLORIDE CRYS ER 20 MEQ PO TBCR: 20.0000 meq | EXTENDED_RELEASE_TABLET | Freq: Every day | ORAL | 0 refills | Status: AC

## 2020-08-23 MED ORDER — POLYETHYLENE GLYCOL 3350 17 G PO PACK: 17.0000 g | PACK | Freq: Every day | ORAL | Status: DC | PRN

## 2020-08-23 MED ORDER — SENNOSIDES-DOCUSATE SODIUM 8.6-50 MG PO TABS
1.0000 | ORAL_TABLET | Freq: Two times a day (BID) | ORAL | Status: DC
  Administered 2020-08-23: 1 via ORAL

## 2020-08-23 MED ORDER — SENNOSIDES-DOCUSATE SODIUM 8.6-50 MG PO TABS: 1.0000 | ORAL_TABLET | Freq: Two times a day (BID) | ORAL | 0 refills | Status: DC

## 2020-08-23 NOTE — TOC Transition Note (Signed)
Transition of Care [facility]) - CM/SW Discharge Note  Patient Details  Name: Edward Wise MRN: [id number] Date of Birth: [birth year]  Transition of Care [facility]) CM/SW Contact:  Sherie Don, LCSW Phone Number: 08/23/2020, 10:15 AM  Clinical Narrative: TOC received consult for PCP assistance. Patient has insurance. CSW met with patient and informed patient he can call the number on his insurance card to find local PCPs that accept his insurance. TOC signing off.  Final next level of care: Home/Self Care Barriers to Discharge: Barriers Resolved  Patient Goals and CMS Choice Patient states their goals for this hospitalization and ongoing recovery are:: Discharge home Choice offered to / list presented to : NA  Discharge Plan and Services        DME Arranged: N/A DME Agency: NA  Readmission Risk Interventions No flowsheet data found.

## 2020-08-23 NOTE — Progress Notes (Signed)
   Progress Note  1 Day Post-Op  Subjective: Patient reports abdomen is sore but pain medication helps. He reported some intermittent nausea to RN but says this has been present since prior to admission. He is keeping diet down and passing flatus. Discussed post-operative care and instructions.   Objective: Vital signs in last 24 hours: Temp:  [97.7 F (36.5 C)-98.5 F (36.9 C)] 98.5 F (36.9 C) (05/13 0428) Pulse Rate:  [61-82] 72 (05/13 0428) Resp:  [9-19] 16 (05/13 0428) BP: (111-170)/(64-101) 160/94 (05/13 0428) SpO2:  [97 %-100 %] 100 % (05/13 0428) Last BM Date: 08/20/20  Intake/Output from previous day: 05/12 0701 - 05/13 0700 In: 3352.7 [I.V.:3152.7; IV Piggyback:200] Out: 1625 [Urine:1625] Intake/Output this shift: No intake/output data recorded.  PE: General: pleasant, WD, normal weight male who is laying in bed in NAD HEENT: Sclera are anicteric.   Heart: regular, rate, and rhythm.  Lungs: CTAB, no wheezes, rhonchi, or rales noted.  Respiratory effort nonlabored Abd: soft, appropriately ttp, ND, +BS, incisions c/d/i  MS: all 4 extremities are symmetrical with no cyanosis, clubbing, or edema. Skin: warm and dry with no masses, lesions, or rashes Neuro: Cranial nerves 2-12 grossly intact, sensation is normal throughout Psych: A&Ox3 with an appropriate affect.    Lab Results:  Recent Labs    08/22/20 0418 08/23/20 0450  WBC 4.2 8.3  HGB 11.7* 12.2*  HCT 34.8* 36.5*  PLT 173 198   BMET Recent Labs    08/22/20 0418 08/23/20 0450  NA 137 138  K 4.1 4.0  CL 106 104  CO2 27 27  GLUCOSE 135* 142*  BUN 11 16  CREATININE 0.99 0.98  CALCIUM 8.7* 9.2   PT/INR No results for input(s): LABPROT, INR in the last 72 hours. CMP     Component Value Date/Time   NA 138 08/23/2020 0450   K 4.0 08/23/2020 0450   CL 104 08/23/2020 0450   CO2 27 08/23/2020 0450   GLUCOSE 142 (H) 08/23/2020 0450   BUN 16 08/23/2020 0450   CREATININE 0.98 08/23/2020 0450    CALCIUM 9.2 08/23/2020 0450   PROT 6.6 08/23/2020 0450   ALBUMIN 4.3 08/23/2020 0450   AST 66 (H) 08/23/2020 0450   ALT 86 (H) 08/23/2020 0450   ALKPHOS 59 08/23/2020 0450   BILITOT 0.7 08/23/2020 0450   GFRNONAA >60 08/23/2020 0450   Lipase     Component Value Date/Time   LIPASE 46 08/22/2020 0418       Studies/Results: No results found.  Anti-infectives: Anti-infectives (From admission, onward)   Start     Dose/Rate Route Frequency Ordered Stop   08/22/20 0915  ciprofloxacin (CIPRO) IVPB 400 mg        400 mg 200 mL/hr over 60 Minutes Intravenous  Once 08/22/20 3419 08/22/20 1247       Assessment/Plan Uncontrolled T2DM - blood glucose improving, A1c 9 HTN Hx of alcohol use  Gallstone pancreatitis  S/p laparoscopic cholecystectomy 08/22/20 Dr. Donell Beers  - POD#1 - tolerating diet and passing flatus - sore but pain well controlled - stable for discharge from surgery perspective - sent Rx for pain meds, and follow up in AVS  FEN: soft diet  VTE: lovenox ID: cipro pre-op  LOS: 4 days    Juliet Rude, [facility] 08/23/2020, 9:47 AM Please see Amion for pager number during day hours 7:00am-4:30pm

## 2020-08-23 NOTE — Discharge Summary (Addendum)
Physician Discharge Summary  Edward Wise ZCH:[id number] DOB: 1970/10/02 DOA: 08/19/2020  PCP: Pcp, No  Admit date: 08/19/2020 Discharge date: 08/23/2020  Time spent: 55 minutes  Recommendations for Outpatient Follow-up:  1. Follow-up with PCP in 2 weeks.  On follow-up patient's diabetes will need to be reassessed as patient's metformin has been changed to glipizide per patient request and due to side effects from metformin per patient.  On follow-up patient will need a basic metabolic profile done to follow-up on electrolytes and renal function. 2. Follow-up with [facility] on 09/10/2020 at 10:15 AM, for follow-up on laparoscopic cholecystectomy.   Discharge Diagnoses:  Principal Problem:   Acute gallstone pancreatitis Active Problems:   Diabetes mellitus with hyperglycemia (HCC)   Hypertension   Hypokalemia   Calculus of gallbladder without cholecystitis without obstruction   S/P laparoscopic cholecystectomy   Discharge Condition: Stable and improved  Diet recommendation: Carb modified diet  Filed Weights   08/19/20 1020  Weight: 97.5 kg    History of present illness:  HPI per Dr. Ileene Wise is a 50 y.o. male with medical history significant for type 2 diabetes mellitus and hypertension who presented to the ER for evaluation of abdominal pain which he has had for 3 days.  Pain was mostly in the epigastrium/periumbilical area as well as right upper quadrant and has been constant for the last 3 days.  He rated his pain a 7 x 10 in intensity at its worst associated with nausea and multiple episodes of emesis. Patient stated that he has had a similar episode in the past and had taken Zofran at home to help with his symptoms without any significant improvement.  He has been unable to tolerate any oral intake and was also unable to take his medications as well. He denies having any fever or chills, no cough, no chest pain, no shortness of breath, no dizziness, no  lightheadedness, no changes in his bowel habits, no urinary symptoms, no weakness, no headache, no cough He denied any alcohol ingestion or NSAID use. Labs show sodium 132, potassium 4.1, chloride 93, bicarb 23, glucose 430, BUN 27, creatinine 1.23, calcium 10.0, alkaline phosphatase 81, albumin 5.2, lipase 162, AST 21, ALT 26, total protein 9.0, white count 6.9, hemoglobin 16.0, hematocrit 46, MCV 89.5, RDW 12.5, platelet count 247 CT scan of abdomen and pelvis shows no acute findings in the abdomen or pelvis. Specifically, no CT evidence of acute pancreatitis or appendicitis.Mild hepatic steatosis. Aortic atherosclerosis  Gallbladder ultrasound showed cholelithiasis with positive sonographic Murphy's sign. No wall thickening or pericholecystic fluid is noted. Fatty liver.    ED Course: Patient is a 50 year old African-American male with a history of diabetes mellitus and hypertension who presented to the ER for evaluation of a 3-day history of abdominal pain mostly in the epigastrium/periumbilical and right upper quadrant associated with nausea and vomiting.  Labs reveal elevated lipase levels as well as hyperglycemia.  Gallbladder ultrasound shows cholelithiasis.  Patient admitted to the hospital for further evaluation.  Hospital Course:  1 acute gallstone pancreatitis with intractable nausea vomiting abdominal pain, POA -Patient had presented with abdominal pain mostly in the epigastric/periumbilical region, right upper quadrant pain. -CT abdomen and pelvis done on presentation with no acute findings in the abdomen or pelvis, specifically no CT evidence of acute pancreatitis or appendicitis, mild hepatic steatosis, aortic atherosclerosis. -Patient was placed on bowel rest, aggressive fluid resuscitation, pain management, antiemetics. -Lipase noted to be elevated at 162 on presentation with trended  down during the hospitalization with clinical improvement. -Right upper quadrant ultrasound  done with cholelithiasis, positive sonographic Murphy sign, no wall thickening or pericholecystic fluid noted, fatty liver. -General surgery was consulted and saw the patient during the hospitalization.  -As patient improved clinically and pancreatitis cooldown patient subsequently underwent laparoscopic cholecystectomy 08/22/2020 without any complications.   -Patient be discharged home in stable and improved condition and will follow up in the outpatient setting with general surgery.   -Patient also follow-up with PCP in the outpatient setting.    2.  Uncontrolled diabetes mellitus type 2 -Hemoglobin A1c 9.7 (08/19/2020) - patient's oral hypoglycemic agents were held during the hospitalization and patient maintained on sliding scale insulin . -Patient noted to have been on metformin prior to admission patient reporting side effects of GI symptoms and requesting medication be changed.   -Patient be discharged on glipizide 5 mg daily.  Glucose meter kit prescription written for patient.   -Outpatient follow-up with PCP in 2 weeks.   3. hypertension -Patient's HCTZ was held during the hospitalization patient maintained on home regimen of Cozaar.   -HCTZ will be resumed on discharge  -Outpatient follow-up with PCP.    4.  Hypokalemia -Repleted.    Procedures:  CT abdomen and pelvis 08/19/2020  Right upper quadrant ultrasound 08/19/2020  Laparoscopic cholecystectomy per Dr. Barry Dienes 08/22/2020   Consultations:  General surgery: Dr. Barry Dienes 08/19/2020    Discharge Exam: Vitals:   08/22/20 2333 08/23/20 0428  BP: (!) 151/93 (!) 160/94  Pulse: 64 72  Resp: 18 16  Temp: 98.4 F (36.9 C) 98.5 F (36.9 C)  SpO2: 100% 100%    General: NAD Cardiovascular: RRR Respiratory: CTA B.  Discharge Instructions   Discharge Instructions    Diet Carb Modified   Complete by: As directed    Increase activity slowly   Complete by: As directed      Allergies as of 08/23/2020      Reactions    Penicillins Hives      Medication List    STOP taking these medications   cyclobenzaprine 10 MG tablet Commonly known as: FLEXERIL   metFORMIN 1000 MG tablet Commonly known as: GLUCOPHAGE   naproxen 375 MG tablet Commonly known as: NAPROSYN   ondansetron 4 MG disintegrating tablet Commonly known as: Zofran ODT   sucralfate 1 GM/10ML suspension Commonly known as: Carafate     TAKE these medications   acetaminophen 325 MG tablet Commonly known as: TYLENOL Take 2 tablets (650 mg total) by mouth every 6 (six) hours as needed for mild pain.   baclofen 10 MG tablet Commonly known as: LIORESAL TAKE 1/2 TO 1 TABLET BY MOUTH AT BEDTIME AS NEEDED FOR MUSCLE SPASMS. What changed: See the new instructions.   blood glucose meter kit and supplies Dispense based on patient and insurance preference. Use up to four times daily as directed. (FOR ICD-10 E10.9, E11.9).   diphenhydrAMINE 25 MG tablet Commonly known as: BENADRYL Take 25 mg by mouth every 6 (six) hours as needed for allergies.   DULoxetine 60 MG capsule Commonly known as: CYMBALTA Take 60 mg by mouth daily.   glipiZIDE 5 MG tablet Commonly known as: GLUCOTROL Take 1 tablet (5 mg total) by mouth daily before breakfast.   hydrochlorothiazide 25 MG tablet Commonly known as: HYDRODIURIL Take 25 mg by mouth daily.   losartan 50 MG tablet Commonly known as: COZAAR Take 1 tablet (50 mg total) by mouth daily.   ondansetron 4 MG tablet Commonly  known as: ZOFRAN Take 1 tablet (4 mg total) by mouth every 8 (eight) hours as needed for nausea or vomiting.   oxyCODONE 5 MG immediate release tablet Commonly known as: Oxy IR/ROXICODONE Take 1 tablet (5 mg total) by mouth every 6 (six) hours as needed for moderate pain.   pantoprazole 40 MG tablet Commonly known as: Protonix Take 1 tablet (40 mg total) by mouth daily.   polyethylene glycol 17 g packet Commonly known as: MIRALAX / GLYCOLAX Take 17 g by mouth daily as  needed for mild constipation.   potassium chloride SA 20 MEQ tablet Commonly known as: KLOR-CON Take 1 tablet (20 mEq total) by mouth daily.   senna-docusate 8.6-50 MG tablet Commonly known as: Senokot-S Take 1 tablet by mouth 2 (two) times daily for 10 days.      Allergies  Allergen Reactions  . Penicillins Hives    Follow-up Information    [facility], [facility] [place]. Go on 09/10/2020.   Specialty: General Surgery Why: 10:15 AM. Please arrive 30 min prior to appointment time. Bring photo ID and insurance information.  Contact information: [place] [phone]        PCP. Schedule an appointment as soon as possible for a visit in 2 week(s).                The results of significant diagnostics from this hospitalization (including imaging, microbiology, ancillary and laboratory) are listed below for reference.    Significant Diagnostic Studies: CT Abdomen Pelvis W Contrast  Result Date: 08/19/2020 CLINICAL DATA:  Right lower quadrant abdominal pain. Elevated serum lipase. EXAM: CT ABDOMEN AND PELVIS WITH CONTRAST TECHNIQUE: Multidetector CT imaging of the abdomen and pelvis was performed using the standard protocol following bolus administration of intravenous contrast. CONTRAST:  139m OMNIPAQUE IOHEXOL 300 MG/ML  SOLN COMPARISON:  CT 01/25/2020 FINDINGS: Lower chest: Included lung bases are clear.  Heart size is normal. Hepatobiliary: Mildly decreased attenuation of the hepatic parenchyma. No focal liver lesion. Gallbladder appears unremarkable. No hyperdense gallstone. No biliary dilatation. Pancreas: Unremarkable. No pancreatic ductal dilatation or surrounding inflammatory changes. Pancreatic parenchyma enhances homogeneously. No adjacent fluid or fluid collections. Spleen: Normal in size without focal abnormality. Adrenals/Urinary Tract: Unremarkable adrenal glands. Stable 12 mm right renal cyst. Kidneys have otherwise symmetric  enhancement. No renal stone or hydronephrosis. Ureters and urinary bladder within normal limits. Stomach/Bowel: Stomach is within normal limits. Appendix appears normal (series 2, image 61). No evidence of bowel wall thickening, distention, or inflammatory changes. Vascular/Lymphatic: Minimal scattered aortoiliac atherosclerotic calcifications without aneurysm. No abdominopelvic lymphadenopathy. Reproductive: Prostate is unremarkable. Other: No free fluid. No abdominopelvic fluid collection. No pneumoperitoneum. No abdominal wall hernia. Musculoskeletal: No acute osseous findings. Mild degenerative changes of the bilateral hips. No suspicious bone lesions. IMPRESSION: 1. No acute findings in the abdomen or pelvis. Specifically, no CT evidence of acute pancreatitis or appendicitis. 2. Mild hepatic steatosis. 3. Aortic atherosclerosis (ICD10-I70.0). Electronically Signed   By: NDavina PokeD.O.   On: 08/19/2020 14:02   UKoreaAbdomen Limited RUQ (LIVER/GB)  Result Date: 08/19/2020 CLINICAL DATA:  Right-sided abdominal pain EXAM: ULTRASOUND ABDOMEN LIMITED RIGHT UPPER QUADRANT COMPARISON:  CT from earlier in the same day. FINDINGS: Gallbladder: Well distended with multiple gallstones within. Positive sonographic Murphy's sign is noted. No wall thickening or pericholecystic fluid is noted. Common bile duct: Diameter: 3 mm. Liver: Increased echogenicity is noted consistent with fatty infiltration. Portal vein is patent on color Doppler imaging with  normal direction of blood flow towards the liver. Other: None. IMPRESSION: Cholelithiasis with positive sonographic Murphy's sign. No wall thickening or pericholecystic fluid is noted. Fatty liver. Electronically Signed   By: Inez Catalina M.D.   On: 08/19/2020 15:29    Microbiology: Recent Results (from the past 240 hour(s))  Resp Panel by RT-PCR (Flu A&B, Covid) Nasopharyngeal Swab     Status: None   Collection Time: 08/19/20  4:53 PM   Specimen: Nasopharyngeal  Swab; Nasopharyngeal(NP) swabs in vial transport medium  Result Value Ref Range Status   SARS Coronavirus 2 by RT PCR NEGATIVE NEGATIVE Final    Comment: (NOTE) SARS-CoV-2 target nucleic acids are NOT DETECTED.  The SARS-CoV-2 RNA is generally detectable in upper respiratory specimens during the acute phase of infection. The lowest concentration of SARS-CoV-2 viral copies this assay can detect is 138 copies/mL. A negative result does not preclude SARS-Cov-2 infection and should not be used as the sole basis for treatment or other patient management decisions. A negative result may occur with  improper specimen collection/handling, submission of specimen other than nasopharyngeal swab, presence of viral mutation(s) within the areas targeted by this assay, and inadequate number of viral copies(<138 copies/mL). A negative result must be combined with clinical observations, patient history, and epidemiological information. The expected result is Negative.  Fact Sheet for Patients:  EntrepreneurPulse.com.au  Fact Sheet for Healthcare Providers:  IncredibleEmployment.be  This test is no t yet approved or cleared by the [place] FDA and  has been authorized for detection and/or diagnosis of SARS-CoV-2 by FDA under an Emergency Use Authorization (EUA). This EUA will remain  in effect (meaning this test can be used) for the duration of the COVID-19 declaration under Section 564(b)(1) of the Act, 21 U.S.C.section 360bbb-3(b)(1), unless the authorization is terminated  or revoked sooner.       Influenza A by PCR NEGATIVE NEGATIVE Final   Influenza B by PCR NEGATIVE NEGATIVE Final    Comment: (NOTE) The Xpert Xpress SARS-CoV-2/FLU/RSV plus assay is intended as an aid in the diagnosis of influenza from Nasopharyngeal swab specimens and should not be used as a sole basis for treatment. Nasal washings and aspirates are unacceptable for Xpert Xpress  SARS-CoV-2/FLU/RSV testing.  Fact Sheet for Patients: EntrepreneurPulse.com.au  Fact Sheet for Healthcare Providers: IncredibleEmployment.be  This test is not yet approved or cleared by the [place] FDA and has been authorized for detection and/or diagnosis of SARS-CoV-2 by FDA under an Emergency Use Authorization (EUA). This EUA will remain in effect (meaning this test can be used) for the duration of the COVID-19 declaration under Section 564(b)(1) of the Act, 21 U.S.C. section 360bbb-3(b)(1), unless the authorization is terminated or revoked.  Performed at [facility], [place] [address] [place], [place] [address]   Surgical pcr screen     Status: None   Collection Time: 08/21/20  3:54 AM   Specimen: Nasal Mucosa; Nasal Swab  Result Value Ref Range Status   MRSA, PCR NEGATIVE NEGATIVE Final   Staphylococcus aureus NEGATIVE NEGATIVE Final    Comment: (NOTE) The Xpert SA Assay (FDA approved for NASAL specimens in patients 26 years of age and older), is one component of a comprehensive surveillance program. It is not intended to diagnose infection nor to guide or monitor treatment. Performed at [facility], [place] [address], [place], [place] [address]      Labs: Basic Metabolic Panel: Recent Labs  Lab 08/19/20 1130 08/20/20 0429 08/21/20 0435 08/22/20  0418 08/23/20 0450  NA 132* 137 135 137 138  K 4.1 3.5 3.3* 4.1 4.0  CL 93* 101 102 106 104  CO2 _0 GLUCOSE 430* 173* 175* 135* 142*  BUN 27* 22* _1 CREATININE 1.23 0.96 1.08 0.99 0.98  CALCIUM 10.0 9.1 8.7* 8.7* 9.2  MG  --   --  2.0 2.0 1.9   Liver Function Tests: Recent Labs  Lab 08/19/20 1130 08/21/20 0435 08/22/20 0418 08/23/20 0450  AST 21 16 45* 66*  ALT 26 21 60* 86*  ALKPHOS 81 55 51 59  BILITOT 1.2 1.2 0.9 0.7  PROT 9.0* 6.6 6.3* 6.6  ALBUMIN 5.2* 4.2 3.9 4.3   Recent Labs  Lab 08/19/20 1130  08/21/20 0435 08/22/20 0418  LIPASE 162* 31 46   No results for input(s): AMMONIA in the last 168 hours. CBC: Recent Labs  Lab 08/19/20 1130 08/20/20 0429 08/21/20 0435 08/21/20 2028 08/22/20 0418 08/23/20 0450  WBC 6.9 6.5 4.7  --  4.2 8.3  NEUTROABS  --   --   --   --  1.1*  --   HGB 16.0 13.7 12.9* 12.3* 11.7* 12.2*  HCT 46.0 40.8 39.0 36.3* 34.8* 36.5*  MCV 89.5 92.3 91.8  --  92.1 92.2  PLT 247 220 173  --  173 198   Cardiac Enzymes: No results for input(s): CKTOTAL, CKMB, CKMBINDEX, TROPONINI in the last 168 hours. BNP: BNP (last 3 results) No results for input(s): BNP in the last 8760 hours.  ProBNP (last 3 results) No results for input(s): PROBNP in the last 8760 hours.  CBG: Recent Labs  Lab 08/22/20 1532 08/22/20 2006 08/22/20 2336 08/23/20 0430 08/23/20 0735  GLUCAP 151* 228* 146* 138* 191*       Signed:  Irine Seal MD.  [facility] 08/23/2020, 10:37 AM

## 2020-08-23 NOTE — Progress Notes (Signed)
Inpatient Diabetes Program Recommendations  AACE/ADA: New Consensus Statement on Inpatient Glycemic Control (2015)  Target Ranges:  Prepandial:   less than 140 mg/dL      Peak postprandial:   less than 180 mg/dL (1-2 hours)      Critically ill patients:  140 - 180 mg/dL   Lab Results  Component Value Date   GLUCAP 191 (H) 08/23/2020   HGBA1C 9.7 (H) 08/19/2020    Review of Glycemic Control  Diabetes history: DM 2 Outpatient Diabetes medications: Metformin 1000 mg bid Current orders for Inpatient glycemic control:  Novolog 0-15 units Q4 hours Decadron 8 mg given yesterday  Inpatient Diabetes Program Recommendations:    Pt reporting side effects to metformin wished to switch medication. Pt also reporting needing a new meter.  -  Glipizide 5 mg Daily -  Glucose meter kit order #[id number]  Thanks,  Tama Headings RN, MSN, BC-ADM Inpatient Diabetes Coordinator Team Pager [phone] (8a-5p)

## 2020-08-23 NOTE — Progress Notes (Signed)
Pt alert and oriented, tolerating diet. D/C instructions given, pt d/cd home. 

## 2021-02-20 IMAGING — MR MR LUMBAR SPINE W/O CM
4 of 5 series · 27 of 48 positions shown · non-contrast
Comparison: None.

CLINICAL DATA: Initial evaluation for chronic left-sided lower back
pain with left-sided radicular symptoms.

EXAM:
MRI LUMBAR SPINE WITHOUT CONTRAST
TECHNIQUE: Multiplanar, multisequence MR imaging of the lumbar spine was
performed. No intravenous contrast was administered.

[Series 3: T2 · sagittal · 4.0mm · 1.09mm/px · 6 of 17 slices shown (1 of 2)]
[im 1/17]
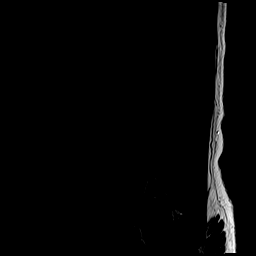
[im 4/17]
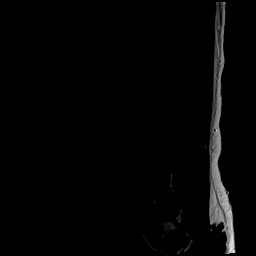
[im 7/17]
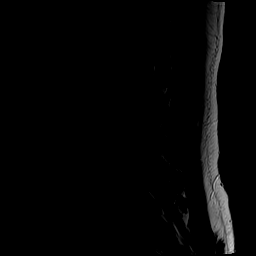
[im 10/17]
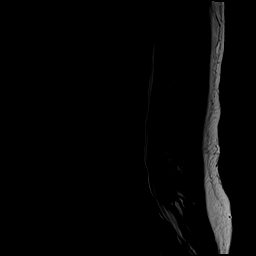
[im 13/17]
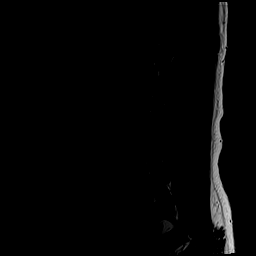
[im 17/17]
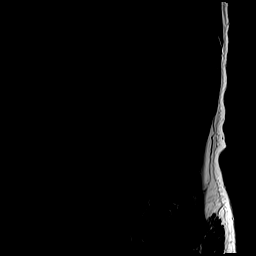

[Series 5: T1 · sagittal · 4.0mm · 1.09mm/px · 6 of 17 slices shown (1 of 2)]
[im 1/17]
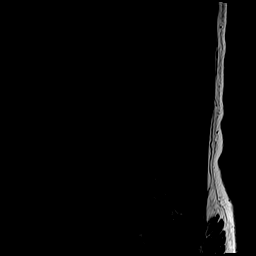
[im 4/17]
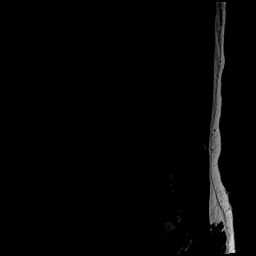
[im 7/17]
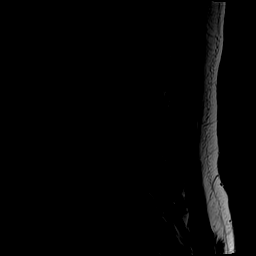
[im 10/17]
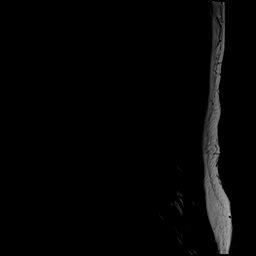
[im 13/17]
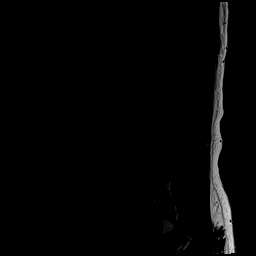
[im 17/17]
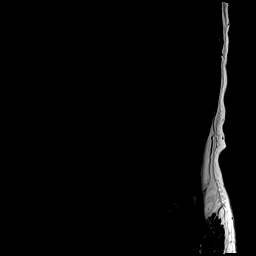

[Series 6: T2 · axial · 4.0mm · 0.39mm/px · z∈[-162,+70]mm · 9 of 45 slices shown (2 of 2)]
[im 1/45]
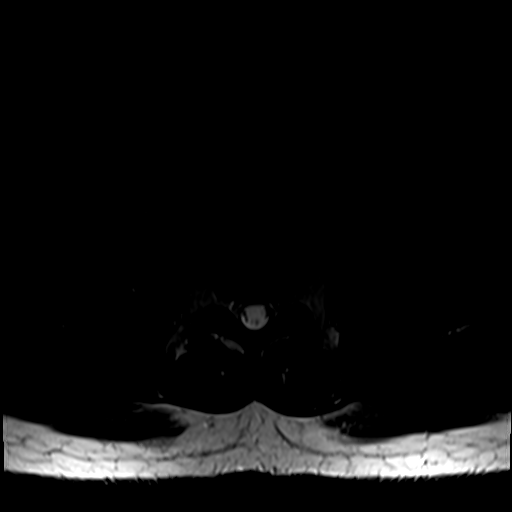
[im 7/45]
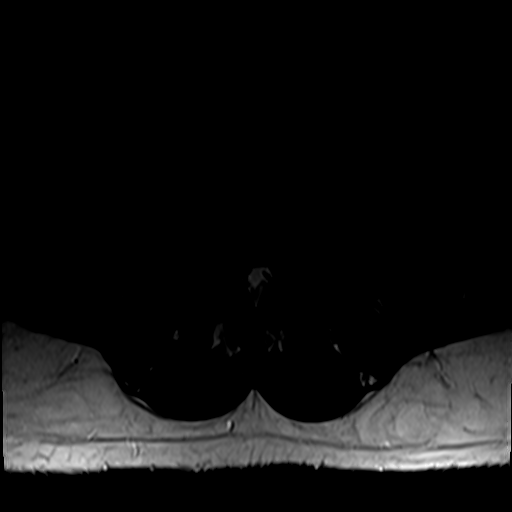
[im 13/45]
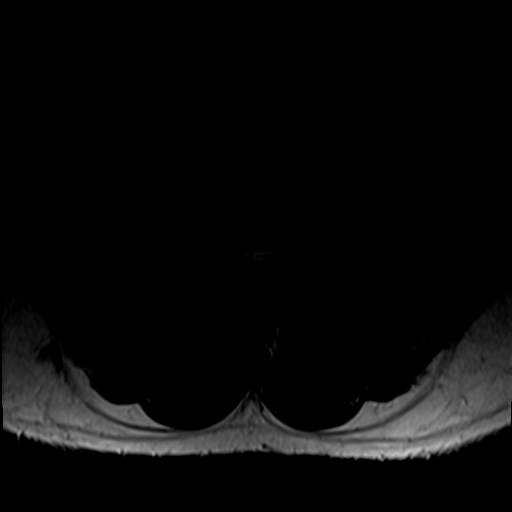
[im 19/45]
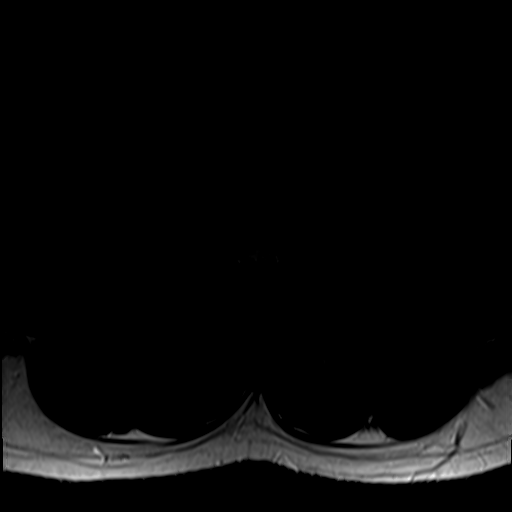
[im 23/45]
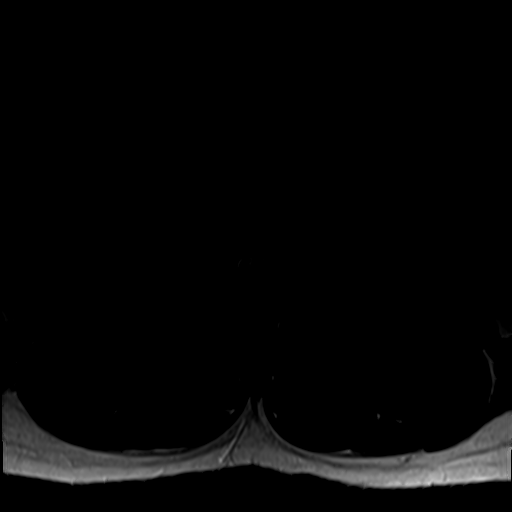
[im 26/45]
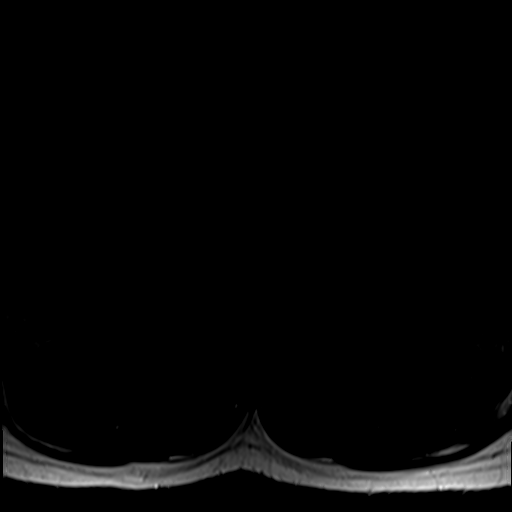
[im 32/45]
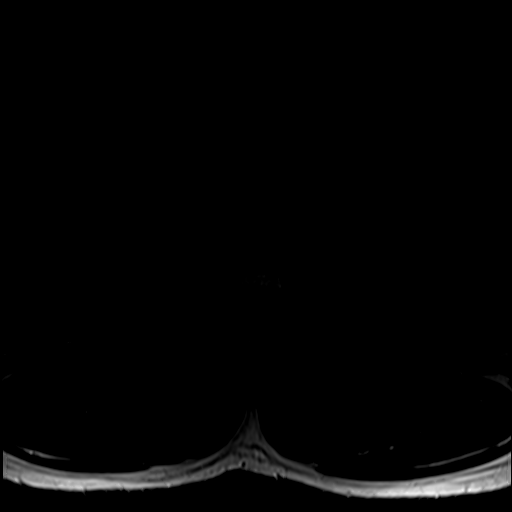
[im 38/45]
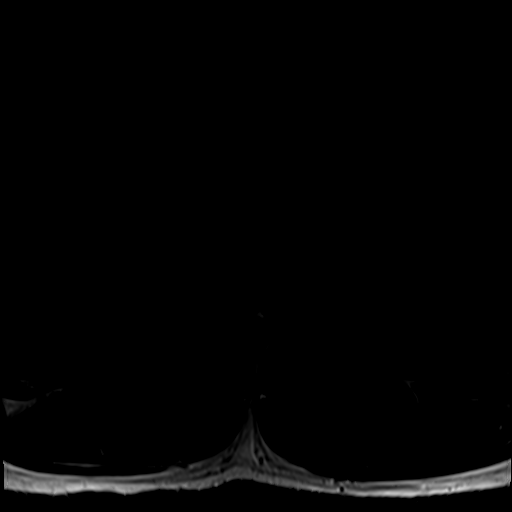
[im 45/45]
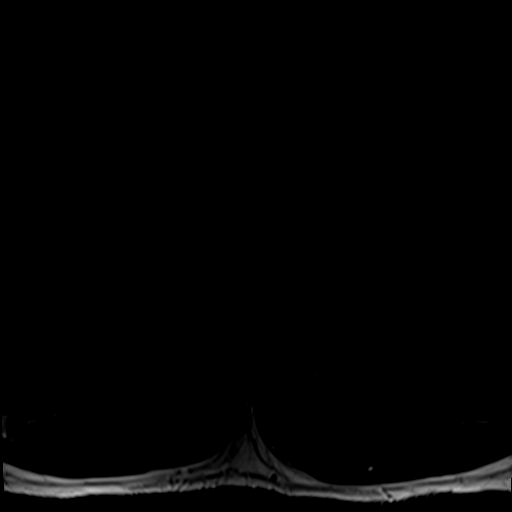

[Series 7: T1 · axial · 4.0mm · 0.39mm/px · z∈[-162,+37]mm · 6 of 45 slices shown (2 of 2)]
[im 1/45]
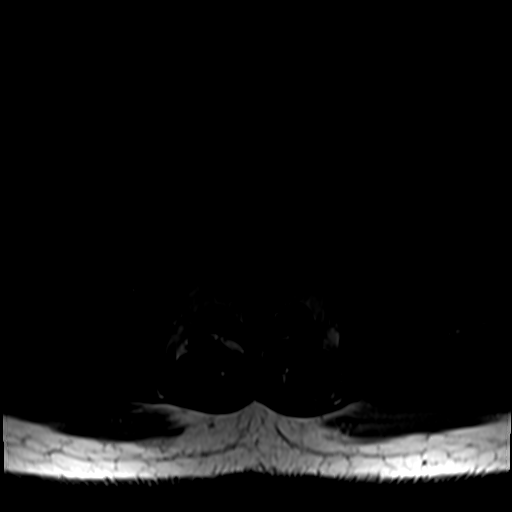
[im 7/45]
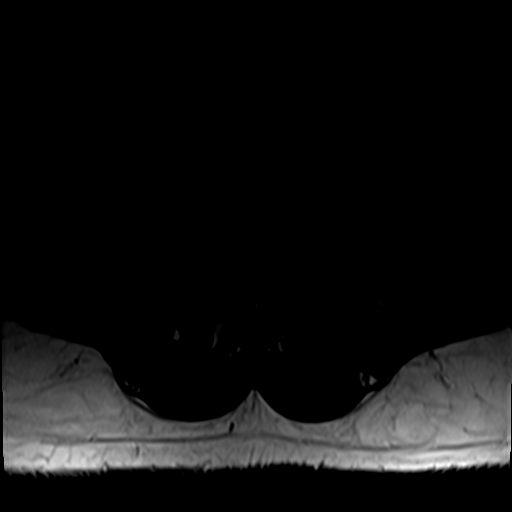
[im 13/45]
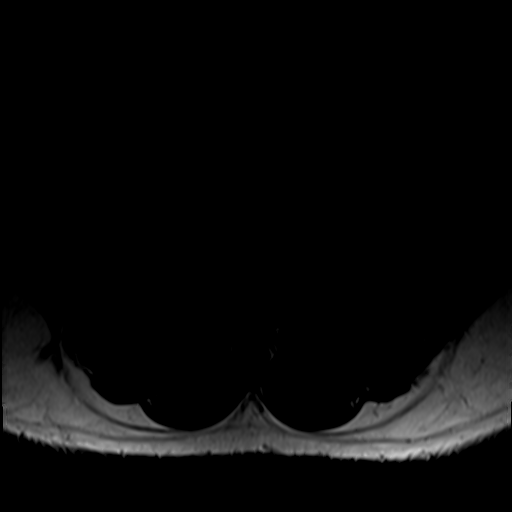
[im 19/45]
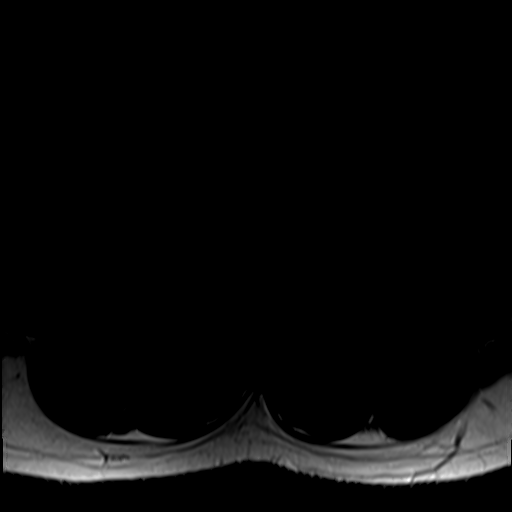
[im 23/45]
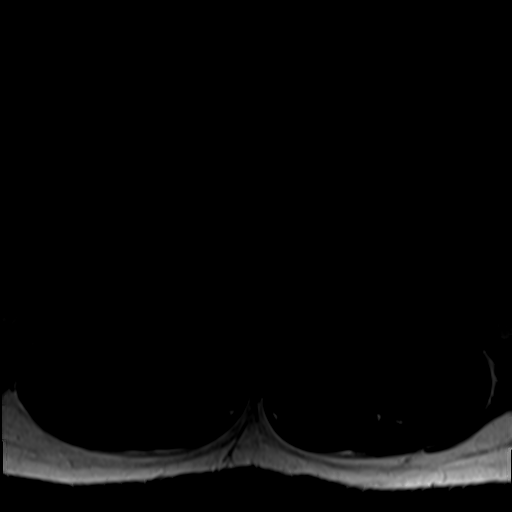
[im 38/45]
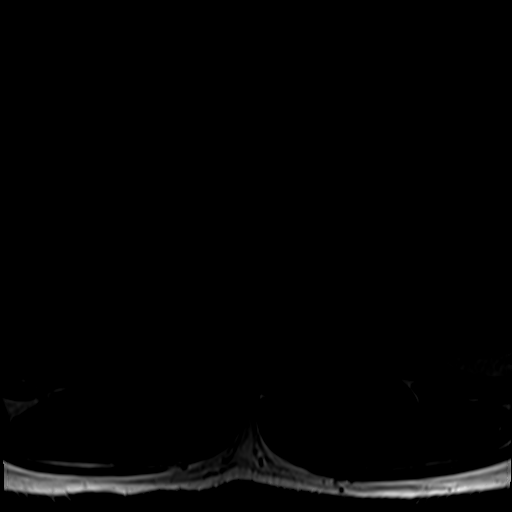

[27 of 48 positions shown; findings below may reference images not displayed]

FINDINGS: Segmentation:  Standard.

Alignment:  Physiologic.  No listhesis or static subluxation.

Vertebrae: Vertebral body height maintained without acute or chronic
fracture. Bone marrow signal intensity diffusely decreased on T1
weighted imaging, nonspecific, but most commonly related to anemia,
smoking, or obesity. No discrete or worrisome osseous lesions. No
abnormal marrow edema.

Conus medullaris and cauda equina: Conus extends to the L1 level.
Conus and cauda equina appear normal.

Paraspinal and other soft tissues: Paraspinous soft tissues within
normal limits. 1.5 cm benign appearing T2 hyperintense cyst noted at
the posterior aspect of the right upper kidney. Possible small
amount of layering internal proteinaceous material and/or debris.
Visualized visceral structures otherwise unremarkable.

Disc levels:

L1-2:  Unremarkable.

L2-3:  Unremarkable.

L3-4: Minimal annular disc bulge with disc desiccation. No spinal
stenosis. Foramina remain patent.

L4-5: Mild diffuse disc bulge with disc desiccation. Bulging disc
contacts both of the exiting L4 nerve roots as they course out of
the neural foramina, potentially irritating either (series 7, image
33). Superimposed mild facet hypertrophy. Resultant mild spinal
stenosis. Mild bilateral L4 foraminal narrowing.

L5-S1: Normal interspace. Minimal facet hypertrophy. No spinal
stenosis. Foramina remain patent.
IMPRESSION: 1. Mild disc bulging at L4-5, contacting and potentially irritating
either of the exiting L4 nerve roots as they course out of the
neural foramina. Resultant mild canal with bilateral L4 foraminal
stenosis at this level.
2. Mild bilateral facet hypertrophy at L4-5 and L5-S1.

## 2021-07-25 ENCOUNTER — Emergency Department (HOSPITAL_COMMUNITY): Payer: 59

## 2021-07-25 ENCOUNTER — Other Ambulatory Visit: Payer: Self-pay

## 2021-07-25 ENCOUNTER — Observation Stay (HOSPITAL_COMMUNITY)
Admission: EM | Admit: 2021-07-25 | Discharge: 2021-07-27 | Disposition: A | Discharge status: Home / Self Care | Payer: 59 | Attending: Family Medicine | Admitting: Family Medicine

## 2021-07-25 ENCOUNTER — Encounter (HOSPITAL_COMMUNITY): Payer: Self-pay

## 2021-07-25 DIAGNOSIS — I10 Essential (primary) hypertension: Secondary | ICD-10-CM | POA: Insufficient documentation

## 2021-07-25 DIAGNOSIS — R112 Nausea with vomiting, unspecified: Secondary | ICD-10-CM | POA: Insufficient documentation

## 2021-07-25 DIAGNOSIS — Z8249 Family history of ischemic heart disease and other diseases of the circulatory system: Secondary | ICD-10-CM

## 2021-07-25 DIAGNOSIS — Z7984 Long term (current) use of oral hypoglycemic drugs: Secondary | ICD-10-CM | POA: Diagnosis not present

## 2021-07-25 DIAGNOSIS — Z79899 Other long term (current) drug therapy: Secondary | ICD-10-CM | POA: Diagnosis not present

## 2021-07-25 DIAGNOSIS — N179 Acute kidney failure, unspecified: Principal | ICD-10-CM | POA: Insufficient documentation

## 2021-07-25 DIAGNOSIS — E1165 Type 2 diabetes mellitus with hyperglycemia: Secondary | ICD-10-CM | POA: Insufficient documentation

## 2021-07-25 DIAGNOSIS — R1013 Epigastric pain: Secondary | ICD-10-CM | POA: Diagnosis present

## 2021-07-25 DIAGNOSIS — E871 Hypo-osmolality and hyponatremia: Secondary | ICD-10-CM | POA: Diagnosis present

## 2021-07-25 DIAGNOSIS — R109 Unspecified abdominal pain: Secondary | ICD-10-CM

## 2021-07-25 DIAGNOSIS — R7989 Other specified abnormal findings of blood chemistry: Secondary | ICD-10-CM | POA: Diagnosis present

## 2021-07-25 DIAGNOSIS — E860 Dehydration: Secondary | ICD-10-CM | POA: Diagnosis present

## 2021-07-25 DIAGNOSIS — Z880 Allergy status to penicillin: Secondary | ICD-10-CM

## 2021-07-25 DIAGNOSIS — Z833 Family history of diabetes mellitus: Secondary | ICD-10-CM

## 2021-07-25 DIAGNOSIS — Z9049 Acquired absence of other specified parts of digestive tract: Secondary | ICD-10-CM

## 2021-07-25 LAB — URINALYSIS, ROUTINE W REFLEX MICROSCOPIC
Bacteria, UA: NONE SEEN
Bilirubin Urine: NEGATIVE
Broad Casts, UA: 4
Glucose, UA: >=500 mg/dL — AB
Hgb urine dipstick: NEGATIVE
Ketones, ur: NEGATIVE mg/dL
Leukocytes,Ua: NEGATIVE
Nitrite: NEGATIVE
Protein, ur: NEGATIVE mg/dL
Specific Gravity, Urine: 1.021 (ref 1.005–1.030)
pH: 5 (ref 5.0–8.0)

## 2021-07-25 LAB — CBC WITH DIFFERENTIAL/PLATELET
Abs Immature Granulocytes: 0.02 10*3/uL (ref 0.00–0.07)
Basophils Absolute: 0 10*3/uL (ref 0.0–0.1)
Basophils Relative: 0 %
Eosinophils Absolute: 0 10*3/uL (ref 0.0–0.5)
Eosinophils Relative: 0 %
HCT: 46.4 % (ref 39.0–52.0)
Hemoglobin: 16.2 g/dL (ref 13.0–17.0)
Immature Granulocytes: 0 %
Lymphocytes Relative: 29 %
Lymphs Abs: 2.4 10*3/uL (ref 0.7–4.0)
MCH: 30.6 pg (ref 26.0–34.0)
MCHC: 34.9 g/dL (ref 30.0–36.0)
MCV: 87.5 fL (ref 80.0–100.0)
Monocytes Absolute: 0.4 10*3/uL (ref 0.1–1.0)
Monocytes Relative: 5 %
Neutro Abs: 5.4 10*3/uL (ref 1.7–7.7)
Neutrophils Relative %: 66 %
Platelets: 249 10*3/uL (ref 150–400)
RBC: 5.3 MIL/uL (ref 4.22–5.81)
RDW: 12.4 % (ref 11.5–15.5)
WBC: 8.2 10*3/uL (ref 4.0–10.5)
nRBC: 0 % (ref 0.0–0.2)

## 2021-07-25 LAB — COMPREHENSIVE METABOLIC PANEL
ALT: 41 U/L (ref 0–44)
AST: 27 U/L (ref 15–41)
Albumin: 5.2 g/dL — ABNORMAL HIGH (ref 3.5–5.0)
Alkaline Phosphatase: 74 U/L (ref 38–126)
Anion gap: 10 (ref 5–15)
BUN: 42 mg/dL — ABNORMAL HIGH (ref 6–20)
CO2: 25 mmol/L (ref 22–32)
Calcium: 9.7 mg/dL (ref 8.9–10.3)
Chloride: 97 mmol/L — ABNORMAL LOW (ref 98–111)
Creatinine, Ser: 2.25 mg/dL — ABNORMAL HIGH (ref 0.61–1.24)
GFR, Estimated: 35 mL/min — ABNORMAL LOW (ref >60)
Glucose, Bld: 350 mg/dL — ABNORMAL HIGH (ref 70–99)
Potassium: 3.5 mmol/L (ref 3.5–5.1)
Sodium: 132 mmol/L — ABNORMAL LOW (ref 135–145)
Total Bilirubin: 1 mg/dL (ref 0.3–1.2)
Total Protein: 8.6 g/dL — ABNORMAL HIGH (ref 6.5–8.1)

## 2021-07-25 LAB — HEMOGLOBIN A1C
Hgb A1c MFr Bld: 7.6 % — ABNORMAL HIGH (ref 4.8–5.6)
Mean Plasma Glucose: 171.42 mg/dL

## 2021-07-25 LAB — LIPASE, BLOOD: Lipase: 32 U/L (ref 11–51)

## 2021-07-25 LAB — CK: Total CK: 459 U/L — ABNORMAL HIGH (ref 49–397)

## 2021-07-25 LAB — GLUCOSE, CAPILLARY
Glucose-Capillary: 193 mg/dL — ABNORMAL HIGH (ref 70–99)
Glucose-Capillary: 194 mg/dL — ABNORMAL HIGH (ref 70–99)

## 2021-07-25 MED ORDER — SODIUM CHLORIDE 0.9 % IV SOLN: INTRAVENOUS | Status: DC

## 2021-07-25 MED ORDER — MORPHINE SULFATE (PF) 4 MG/ML IV SOLN
4.0000 mg | INTRAVENOUS | Status: DC | PRN
  Administered 2021-07-26: 4 mg via INTRAVENOUS
  Filled 2021-07-25: qty 1

## 2021-07-25 MED ORDER — SODIUM CHLORIDE 0.9 % IV BOLUS: 1000.0000 mL | Freq: Once | INTRAVENOUS | Status: DC

## 2021-07-25 MED ORDER — INSULIN ASPART 100 UNIT/ML IJ SOLN
0.0000 [IU] | Freq: Every day | INTRAMUSCULAR | Status: DC
  Filled 2021-07-25: qty 0.05

## 2021-07-25 MED ORDER — PROCHLORPERAZINE EDISYLATE 10 MG/2ML IJ SOLN
10.0000 mg | Freq: Four times a day (QID) | INTRAMUSCULAR | Status: DC | PRN
  Administered 2021-07-25 – 2021-07-26 (×2): 10 mg via INTRAVENOUS
  Filled 2021-07-25 (×2): qty 2

## 2021-07-25 MED ORDER — FENTANYL CITRATE PF 50 MCG/ML IJ SOSY
25.0000 ug | PREFILLED_SYRINGE | Freq: Once | INTRAMUSCULAR | Status: AC
  Administered 2021-07-25: 25 ug via INTRAVENOUS
  Filled 2021-07-25: qty 1

## 2021-07-25 MED ORDER — LACTATED RINGERS IV BOLUS
1000.0000 mL | Freq: Once | INTRAVENOUS | Status: AC
  Administered 2021-07-25: 1000 mL via INTRAVENOUS

## 2021-07-25 MED ORDER — PANTOPRAZOLE SODIUM 40 MG IV SOLR
40.0000 mg | INTRAVENOUS | Status: DC
Start: 2021-07-25 — End: 2021-07-27
  Administered 2021-07-25 – 2021-07-26 (×2): 40 mg via INTRAVENOUS
  Filled 2021-07-25 (×2): qty 10

## 2021-07-25 MED ORDER — DICYCLOMINE HCL 10 MG/ML IM SOLN
20.0000 mg | Freq: Once | INTRAMUSCULAR | Status: AC
  Administered 2021-07-25: 20 mg via INTRAMUSCULAR
  Filled 2021-07-25: qty 2

## 2021-07-25 MED ORDER — FAMOTIDINE IN NACL 20-0.9 MG/50ML-% IV SOLN
20.0000 mg | Freq: Once | INTRAVENOUS | Status: AC
  Administered 2021-07-25: 20 mg via INTRAVENOUS
  Filled 2021-07-25: qty 50

## 2021-07-25 MED ORDER — HYDRALAZINE HCL 20 MG/ML IJ SOLN: 10.0000 mg | Freq: Three times a day (TID) | INTRAMUSCULAR | Status: DC | PRN

## 2021-07-25 MED ORDER — SODIUM CHLORIDE 0.9 % IV BOLUS
1000.0000 mL | Freq: Once | INTRAVENOUS | Status: AC
  Administered 2021-07-25: 1000 mL via INTRAVENOUS

## 2021-07-25 MED ORDER — ONDANSETRON HCL 4 MG/2ML IJ SOLN
4.0000 mg | Freq: Once | INTRAMUSCULAR | Status: AC
  Administered 2021-07-25: 4 mg via INTRAVENOUS
  Filled 2021-07-25: qty 2

## 2021-07-25 MED ORDER — OXYCODONE HCL 5 MG PO TABS
5.0000 mg | ORAL_TABLET | ORAL | Status: DC | PRN
  Administered 2021-07-25 – 2021-07-26 (×2): 5 mg via ORAL
  Filled 2021-07-25 (×2): qty 1

## 2021-07-25 MED ORDER — INSULIN ASPART 100 UNIT/ML IJ SOLN
0.0000 [IU] | Freq: Three times a day (TID) | INTRAMUSCULAR | Status: DC
  Administered 2021-07-25 – 2021-07-27 (×5): 3 [IU] via SUBCUTANEOUS
  Administered 2021-07-27: 2 [IU] via SUBCUTANEOUS
  Filled 2021-07-25: qty 0.15

## 2021-07-25 NOTE — ED Provider Notes (Signed)
?[facility] DEPT ?Provider Note ? ? ?CSN: [id number] ?Arrival date & time: 07/25/21  8768 ? ?  ? ?History ? ?Chief Complaint  ?Patient presents with  ? Abdominal Pain  ? ? ?Edward Wise is a 51 y.o. male presenting to the ED with a chief complaint of epigastric and right-sided abdominal/flank pain, vomiting, diarrhea for the past 3 days.  No inciting factor that he is aware of.  Has tried Zofran with only minimal improvement in his symptoms.  States that anytime he tries to eat or drink anything he will have emesis.  Denies any bloody emesis or bloody stools.  No sick contacts with similar symptoms.  Status postcholecystectomy however states that pain feels similar to when he had pain related to his gallstones.  Denies any congestion, fever, cough, shortness of breath, urinary symptoms or other abdominal surgeries. ? ? ?Abdominal Pain ?Associated symptoms: diarrhea, nausea and vomiting   ?Associated symptoms: no chest pain, no chills, no constipation, no cough, no dysuria, no fever, no hematuria, no shortness of breath and no sore throat   ? ?  ? ?Home Medications ?Prior to Admission medications   ?Medication Sig Start Date End Date Taking? Authorizing Provider  ?acetaminophen (TYLENOL) 325 MG tablet Take 2 tablets (650 mg total) by mouth every 6 (six) hours as needed for mild pain. 08/23/20   Norm Parcel, PA-C  ?baclofen (LIORESAL) 10 MG tablet TAKE 1/2 TO 1 TABLET BY MOUTH AT BEDTIME AS NEEDED FOR MUSCLE SPASMS. ?Patient taking differently: Take 10 mg by mouth daily as needed for muscle spasms. 07/29/20   Hilts, Legrand Como, MD  ?blood glucose meter kit and supplies Dispense based on patient and insurance preference. Use up to four times daily as directed. (FOR ICD-10 E10.9, E11.9). 08/23/20   Eugenie Filler, MD  ?diphenhydrAMINE (BENADRYL) 25 MG tablet Take 25 mg by mouth every 6 (six) hours as needed for allergies.    [provider]  ?DULoxetine (CYMBALTA) 60 MG capsule  Take 60 mg by mouth daily. 01/18/20   [provider]  ?glipiZIDE (GLUCOTROL) 5 MG tablet Take 1 tablet (5 mg total) by mouth daily before breakfast. 08/23/20 08/23/21  Eugenie Filler, MD  ?hydrochlorothiazide (HYDRODIURIL) 25 MG tablet Take 25 mg by mouth daily. 01/27/20   [provider]  ?losartan (COZAAR) 50 MG tablet Take 1 tablet (50 mg total) by mouth daily. 08/23/20   Eugenie Filler, MD  ?ondansetron (ZOFRAN) 4 MG tablet Take 1 tablet (4 mg total) by mouth every 8 (eight) hours as needed for nausea or vomiting. 08/23/20   Norm Parcel, PA-C  ?oxyCODONE (OXY IR/ROXICODONE) 5 MG immediate release tablet Take 1 tablet (5 mg total) by mouth every 6 (six) hours as needed for moderate pain. 08/23/20   Norm Parcel, PA-C  ?pantoprazole (PROTONIX) 40 MG tablet Take 1 tablet (40 mg total) by mouth daily. 08/23/20   Eugenie Filler, MD  ?polyethylene glycol (MIRALAX / GLYCOLAX) 17 g packet Take 17 g by mouth daily as needed for mild constipation. 08/23/20   Eugenie Filler, MD  ?potassium chloride SA (KLOR-CON) 20 MEQ tablet Take 1 tablet (20 mEq total) by mouth daily. 08/23/20   Eugenie Filler, MD  ?   ? ?Allergies    ?Penicillins   ? ?Review of Systems   ?Review of Systems  ?Constitutional:  Negative for appetite change, chills and fever.  ?HENT:  Negative for ear pain, rhinorrhea, sneezing and sore throat.   ?  Eyes:  Negative for photophobia and visual disturbance.  ?Respiratory:  Negative for cough, chest tightness, shortness of breath and wheezing.   ?Cardiovascular:  Negative for chest pain and palpitations.  ?Gastrointestinal:  Positive for abdominal pain, diarrhea, nausea and vomiting. Negative for blood in stool and constipation.  ?Genitourinary:  Negative for dysuria, hematuria and urgency.  ?Musculoskeletal:  Negative for myalgias.  ?Skin:  Negative for rash.  ?Neurological:  Negative for dizziness, weakness and light-headedness.  ? ?Physical Exam ?Updated Vital Signs ?BP  140/83   Pulse 82   Temp 98.3 ?F (36.8 ?C) (Oral)   Resp 18   SpO2 96%  ?Physical Exam ?Vitals and nursing note reviewed.  ?Constitutional:   ?   General: He is not in acute distress. ?   Appearance: He is well-developed.  ?HENT:  ?   Head: Normocephalic and atraumatic.  ?   Nose: Nose normal.  ?Eyes:  ?   General: No scleral icterus.    ?   Left eye: No discharge.  ?   Conjunctiva/sclera: Conjunctivae normal.  ?Cardiovascular:  ?   Rate and Rhythm: Normal rate and regular rhythm.  ?   Heart sounds: Normal heart sounds. No murmur heard. ?  No friction rub. No gallop.  ?Pulmonary:  ?   Effort: Pulmonary effort is normal. No respiratory distress.  ?   Breath sounds: Normal breath sounds.  ?Abdominal:  ?   General: Bowel sounds are normal. There is no distension.  ?   Palpations: Abdomen is soft.  ?   Tenderness: There is abdominal tenderness (R flank) in the right upper quadrant, epigastric area and periumbilical area. There is no guarding.  ?Musculoskeletal:     ?   General: Normal range of motion.  ?   Cervical back: Normal range of motion and neck supple.  ?Skin: ?   General: Skin is warm and dry.  ?   Findings: No rash.  ?Neurological:  ?   Mental Status: He is alert.  ?   Motor: No abnormal muscle tone.  ?   Coordination: Coordination normal.  ? ? ?ED Results / Procedures / Treatments   ?Labs ?(all labs ordered are listed, but only abnormal results are displayed) ?Labs Reviewed  ?COMPREHENSIVE METABOLIC PANEL - Abnormal; Notable for the following components:  ?    Result Value  ? Sodium 132 (*)   ? Chloride 97 (*)   ? Glucose, Bld 350 (*)   ? BUN 42 (*)   ? Creatinine, Ser 2.25 (*)   ? Total Protein 8.6 (*)   ? Albumin 5.2 (*)   ? GFR, Estimated 35 (*)   ? All other components within normal limits  ?LIPASE, BLOOD  ?CBC WITH DIFFERENTIAL/PLATELET  ?URINALYSIS, ROUTINE W REFLEX MICROSCOPIC  ? ? ?EKG ?None ? ?Radiology ?CT Renal Stone Study ? ?Result Date: 07/25/2021 ?CLINICAL DATA:  51 year old male with a  history of flank pain and suspected kidney stone EXAM: CT ABDOMEN AND PELVIS WITHOUT CONTRAST TECHNIQUE: Multidetector CT imaging of the abdomen and pelvis was performed following the standard protocol without IV contrast. RADIATION DOSE REDUCTION: This exam was performed according to the departmental dose-optimization program which includes automated exposure control, adjustment of the mA and/or kV according to patient size and/or use of iterative reconstruction technique. COMPARISON:  08/19/2020 FINDINGS: Lower chest: No acute finding of the lower chest Hepatobiliary: Unremarkable liver.  Cholecystectomy Pancreas: Unremarkable Spleen: Unremarkable Adrenals/Urinary Tract: Unremarkable adrenal glands. Right kidney with no hydronephrosis or nephrolithiasis. Unremarkable right  ureter. Similar appearance of fluid density cystic structure on the posterior cortex. Left kidney without hydronephrosis or nephrolithiasis. Unremarkable left ureter. Unremarkable urinary bladder Stomach/Bowel: - Stomach: Unremarkable. - Small bowel: Unremarkable - Appendix: Normal. - Colon: No significant stool burden.  No inflammatory changes. Vascular/Lymphatic: Minimal atherosclerotic changes of the abdominal aorta. No aneurysm. No mesenteric or retroperitoneal lymphadenopathy. No pelvic lymphadenopathy. Reproductive: Unremarkable prostate Other: None Musculoskeletal: No acute displaced fracture. Mild degenerative changes of the lumbar spine. IMPRESSION: No acute finding of the abdomen/pelvis, with no evidence of nephrolithiasis. Electronically Signed   By: Corrie Mckusick D.O.   On: 07/25/2021 11:04   ? ?Procedures ?Marland KitchenCritical Care ?Performed by: Delia Heady, PA-C ?Authorized by: Delia Heady, PA-C  ? ?Critical care provider statement:  ?  Critical care time (minutes):  35 ?  Critical care time was exclusive of:  Separately billable procedures and treating other patients and teaching time ?  Critical care was necessary to treat or prevent  imminent or life-threatening deterioration of the following conditions:  Renal failure and dehydration ?  Critical care was time spent personally by me on the following activities:  Development of treatment plan wi

## 2021-07-25 NOTE — Plan of Care (Signed)
  Problem: Education: Goal: Knowledge of General Education information will improve Description Including pain rating scale, medication(s)/side effects and non-pharmacologic comfort measures Outcome: Progressing   

## 2021-07-25 NOTE — ED Notes (Signed)
Provided pt with a urinal 

## 2021-07-25 NOTE — ED Triage Notes (Signed)
Pt arrived via POV, c/o abd, n/v diarrhea since Tuesday. States it feels like the pain he had previously due to gallbladder, but gallbladder has been removed since.  ?

## 2021-07-25 NOTE — H&P (Signed)
?History and Physical  ? ? ?Patient: Edward Wise MWU:[id number] DOB: [birth year] ?DOA: 07/25/2021 ?DOS: the patient was seen and examined on 07/25/2021 ?PCP: Pcp, No  ?Patient coming from: Home ? ?Chief Complaint:  ?Chief Complaint  ?Patient presents with  ? Abdominal Pain  ? ?HPI: Edward Wise is a 51 y.o. male with medical history significant of HTN, DM2. Presenting with abdominal pain, N/V. Reports his symptoms started 3 days ago. It started as an epigastric/RUQ crampy pain. It made him feel nauseous. He tried zofran, but it didn't help. He hasn't been able to keep anything down since. He says this pain reminds him of when he had gallbladder issues, but he had his gallbladder removed a year ago. He hasn't had any fevers or sick contacts. No diarrhea. When his symptoms didn't improve this morning, he came to the ED for assistance. He denies any other aggravating or alleviating factors.  ? ?Review of Systems: As mentioned in the history of present illness. All other systems reviewed and are negative. ?Past Medical History:  ?Diagnosis Date  ? Diabetes mellitus without complication ([place])   ? Hypertension   ? Pancreatitis   ? ?Past Surgical History:  ?Procedure Laterality Date  ? CHOLECYSTECTOMY N/A 08/22/2020  ? Procedure: LAPAROSCOPIC CHOLECYSTECTOMY;  Surgeon: Stark Klein, MD;  Location: [facility] ORS;  Service: General;  Laterality: N/A;  ? HERNIA REPAIR    ? ?Social History:  reports that he has never smoked. He has never used smokeless tobacco. He reports that he does not currently use alcohol. He reports that he does not currently use drugs. ? ?Allergies  ?Allergen Reactions  ? Penicillins Hives  ? ? ?Family History  ?Problem Relation Age of Onset  ? Hypertension Mother   ? Diabetes Mother   ? Pancreatitis Father   ? Cancer Father   ? Hypertension Father   ? Diabetes Father   ? ? ?Prior to Admission medications   ?Medication Sig Start Date End Date Taking? Authorizing Provider  ?acetaminophen (TYLENOL) 325 MG tablet  Take 2 tablets (650 mg total) by mouth every 6 (six) hours as needed for mild pain. 08/23/20   Norm Parcel, PA-C  ?baclofen (LIORESAL) 10 MG tablet TAKE 1/2 TO 1 TABLET BY MOUTH AT BEDTIME AS NEEDED FOR MUSCLE SPASMS. ?Patient taking differently: Take 10 mg by mouth daily as needed for muscle spasms. 07/29/20   Hilts, Legrand Como, MD  ?blood glucose meter kit and supplies Dispense based on patient and insurance preference. Use up to four times daily as directed. (FOR ICD-10 E10.9, E11.9). 08/23/20   Eugenie Filler, MD  ?diphenhydrAMINE (BENADRYL) 25 MG tablet Take 25 mg by mouth every 6 (six) hours as needed for allergies.    [provider]  ?DULoxetine (CYMBALTA) 60 MG capsule Take 60 mg by mouth daily. 01/18/20   [provider]  ?glipiZIDE (GLUCOTROL) 5 MG tablet Take 1 tablet (5 mg total) by mouth daily before breakfast. 08/23/20 08/23/21  Eugenie Filler, MD  ?hydrochlorothiazide (HYDRODIURIL) 25 MG tablet Take 25 mg by mouth daily. 01/27/20   [provider]  ?losartan (COZAAR) 50 MG tablet Take 1 tablet (50 mg total) by mouth daily. 08/23/20   Eugenie Filler, MD  ?ondansetron (ZOFRAN) 4 MG tablet Take 1 tablet (4 mg total) by mouth every 8 (eight) hours as needed for nausea or vomiting. 08/23/20   Norm Parcel, PA-C  ?oxyCODONE (OXY IR/ROXICODONE) 5 MG immediate release tablet Take 1 tablet (5 mg total) by mouth every  6 (six) hours as needed for moderate pain. 08/23/20   Norm Parcel, PA-C  ?pantoprazole (PROTONIX) 40 MG tablet Take 1 tablet (40 mg total) by mouth daily. 08/23/20   Eugenie Filler, MD  ?polyethylene glycol (MIRALAX / GLYCOLAX) 17 g packet Take 17 g by mouth daily as needed for mild constipation. 08/23/20   Eugenie Filler, MD  ?potassium chloride SA (KLOR-CON) 20 MEQ tablet Take 1 tablet (20 mEq total) by mouth daily. 08/23/20   Eugenie Filler, MD  ? ? ?Physical Exam: ?Vitals:  ? 07/25/21 1030 07/25/21 1100 07/25/21 1130 07/25/21 1200  ?BP: (!)  150/94 (!) 147/92 140/83 139/87  ?Pulse: 97 88 82 77  ?Resp:  _0 ?Temp:      ?TempSrc:      ?SpO2: 99% 100% 96% 98%  ? ?General: 51 y.o. male resting in bed in NAD ?Eyes: PERRL, normal sclera ?ENMT: Nares patent w/o discharge, orophaynx clear, dentition normal, ears w/o discharge/lesions/ulcers ?Neck: Supple, trachea midline ?Cardiovascular: RRR, +S1, S2, no m/g/r, equal pulses throughout ?Respiratory: CTABL, no w/r/r, normal WOB ?GI: BS+, ND, mild TTP epigastric, no masses noted, no organomegaly noted ?MSK: No e/c/c ?Neuro: A&O x 3, no focal deficits ?Psyc: Appropriate interaction and affect, calm/cooperative ? ?Data Reviewed: ? ?Na+  132 ?Glucose 350 ?BUN  42 ?Scr 2.25 ?CK  459 ?Lipase 32 ? ?CT renal study: No acute finding of the abdomen/pelvis, with no evidence of nephrolithiasis. ? ?Assessment and Plan: ?No notes have been filed under this hospital service. ?Service: Hospitalist ?Abdominal pain ?N/V ?    - place in obs, tele ?    - ab pain is located in epigastric/RUQ area ?    - imaging is negative; exam is pretty benign ?    - anti-emetics, fluids, protonix, bentyl ?    - CLD for now ? ?DM2 uncontolled ?    - A1c, SSI, glucose checks, CLD for now ? ?AKI ?    - no obstruction seen on imaging ?    - fluids, watch nephrotoxins ? ?HTN ?    - hold diuretic, ARB d/t AKI ?    - will have PRNs available ? ? Advance Care Planning:   Code Status: FULL ? ?Consults: None ? ?Family Communication: None at bedside ? ?Severity of Illness: ?The appropriate patient status for this patient is INPATIENT. Inpatient status is judged to be reasonable and necessary in order to provide the required intensity of service to ensure the patient's safety. The patient's presenting symptoms, physical exam findings, and initial radiographic and laboratory data in the context of their chronic comorbidities is felt to place them at high risk for further clinical deterioration. Furthermore, it is not anticipated that the patient will be  medically stable for discharge from the hospital within 2 midnights of admission.  ? ?* I certify that at the point of admission it is my clinical judgment that the patient will require inpatient hospital care spanning beyond 2 midnights from the point of admission due to high intensity of service, high risk for further deterioration and high frequency of surveillance required.* ? ?Author: ?Jonnie Finner, DO ?07/25/2021 12:31 PM ? ?For on call review [email or web address].  ?

## 2021-07-26 DIAGNOSIS — E1165 Type 2 diabetes mellitus with hyperglycemia: Secondary | ICD-10-CM

## 2021-07-26 DIAGNOSIS — Z79899 Other long term (current) drug therapy: Secondary | ICD-10-CM | POA: Diagnosis not present

## 2021-07-26 DIAGNOSIS — Z880 Allergy status to penicillin: Secondary | ICD-10-CM | POA: Diagnosis not present

## 2021-07-26 DIAGNOSIS — Z9049 Acquired absence of other specified parts of digestive tract: Secondary | ICD-10-CM | POA: Diagnosis not present

## 2021-07-26 DIAGNOSIS — Z7984 Long term (current) use of oral hypoglycemic drugs: Secondary | ICD-10-CM | POA: Diagnosis not present

## 2021-07-26 DIAGNOSIS — E871 Hypo-osmolality and hyponatremia: Secondary | ICD-10-CM | POA: Diagnosis present

## 2021-07-26 DIAGNOSIS — I10 Essential (primary) hypertension: Secondary | ICD-10-CM | POA: Diagnosis not present

## 2021-07-26 DIAGNOSIS — E860 Dehydration: Secondary | ICD-10-CM | POA: Diagnosis present

## 2021-07-26 DIAGNOSIS — Z8249 Family history of ischemic heart disease and other diseases of the circulatory system: Secondary | ICD-10-CM | POA: Diagnosis not present

## 2021-07-26 DIAGNOSIS — N179 Acute kidney failure, unspecified: Secondary | ICD-10-CM | POA: Diagnosis not present

## 2021-07-26 DIAGNOSIS — R112 Nausea with vomiting, unspecified: Secondary | ICD-10-CM | POA: Diagnosis not present

## 2021-07-26 DIAGNOSIS — R7989 Other specified abnormal findings of blood chemistry: Secondary | ICD-10-CM | POA: Diagnosis present

## 2021-07-26 DIAGNOSIS — R1013 Epigastric pain: Secondary | ICD-10-CM | POA: Diagnosis present

## 2021-07-26 DIAGNOSIS — Z833 Family history of diabetes mellitus: Secondary | ICD-10-CM | POA: Diagnosis not present

## 2021-07-26 LAB — COMPREHENSIVE METABOLIC PANEL
ALT: 35 U/L (ref 0–44)
AST: 26 U/L (ref 15–41)
Albumin: 4.2 g/dL (ref 3.5–5.0)
Alkaline Phosphatase: 58 U/L (ref 38–126)
Anion gap: 6 (ref 5–15)
BUN: 24 mg/dL — ABNORMAL HIGH (ref 6–20)
CO2: 28 mmol/L (ref 22–32)
Calcium: 9.3 mg/dL (ref 8.9–10.3)
Chloride: 105 mmol/L (ref 98–111)
Creatinine, Ser: 1.29 mg/dL — ABNORMAL HIGH (ref 0.61–1.24)
GFR, Estimated: >60 mL/min (ref >60)
Glucose, Bld: 194 mg/dL — ABNORMAL HIGH (ref 70–99)
Potassium: 4.1 mmol/L (ref 3.5–5.1)
Sodium: 139 mmol/L (ref 135–145)
Total Bilirubin: 1 mg/dL (ref 0.3–1.2)
Total Protein: 6.6 g/dL (ref 6.5–8.1)

## 2021-07-26 LAB — CBC
HCT: 40.9 % (ref 39.0–52.0)
Hemoglobin: 14.1 g/dL (ref 13.0–17.0)
MCH: 30.7 pg (ref 26.0–34.0)
MCHC: 34.5 g/dL (ref 30.0–36.0)
MCV: 89.1 fL (ref 80.0–100.0)
Platelets: 197 10*3/uL (ref 150–400)
RBC: 4.59 MIL/uL (ref 4.22–5.81)
RDW: 12.4 % (ref 11.5–15.5)
WBC: 8.1 10*3/uL (ref 4.0–10.5)
nRBC: 0 % (ref 0.0–0.2)

## 2021-07-26 LAB — GLUCOSE, CAPILLARY
Glucose-Capillary: 175 mg/dL — ABNORMAL HIGH (ref 70–99)
Glucose-Capillary: 197 mg/dL — ABNORMAL HIGH (ref 70–99)
Glucose-Capillary: 161 mg/dL — ABNORMAL HIGH (ref 70–99)
Glucose-Capillary: 144 mg/dL — ABNORMAL HIGH (ref 70–99)

## 2021-07-26 MED ORDER — SENNOSIDES-DOCUSATE SODIUM 8.6-50 MG PO TABS
1.0000 | ORAL_TABLET | Freq: Every day | ORAL | Status: DC
  Administered 2021-07-26 – 2021-07-27 (×2): 1 via ORAL
  Filled 2021-07-26 (×2): qty 1

## 2021-07-26 MED ORDER — BISACODYL 10 MG RE SUPP: 10.0000 mg | Freq: Every day | RECTAL | Status: DC | PRN

## 2021-07-26 MED ORDER — SENNOSIDES-DOCUSATE SODIUM 8.6-50 MG PO TABS: 2.0000 | ORAL_TABLET | Freq: Every day | ORAL | Status: DC

## 2021-07-26 MED ORDER — ONDANSETRON HCL 4 MG/2ML IJ SOLN: 4.0000 mg | Freq: Three times a day (TID) | INTRAMUSCULAR | Status: DC | PRN

## 2021-07-26 NOTE — Progress Notes (Signed)
?Progress Note ? ?Patient: Edward Wise OAC:[id number] DOB: 19-Apr-1970  ?DOA: 07/25/2021  DOS: 07/26/2021  ?  ?Brief hospital course: ?Edward Wise is a 51 y.o. male with medical history significant of HTN, DM2. Presenting with abdominal pain, N/V. Reports his symptoms started 3 days ago. It started as an epigastric/RUQ crampy pain. It made him feel nauseous. He tried zofran, but it didn't help. He hasn't been able to keep anything down since. He says this pain reminds him of when he had gallbladder issues, but he had his gallbladder removed a year ago. He hasn't had any fevers or sick contacts. No diarrhea. When his symptoms didn't improve this morning, he came to the ED for assistance. He denies any other aggravating or alleviating factors.  ? ?Assessment and Plan: ?Nausea, vomiting, abdominal pain:  ?- No explanation of symptoms on CT abd, and exam currently is tender but not peritonitic. Will continue to support with IVF, IV antiemetics, IV analgesics.  ?- Stay on liquid diet. If he begins tolerating this, we can start advancing.  ? ?AKI: Prerenal azotemia due to dehydration from vomiting, no po intake. Improving with IVF.  ?- Continue IVF, continue holding diuretic, decrease rate of IVF.  ?- Hold ARB ? ?Hyponatremia: Resolved with isotonic saline.  ? ?T2DM uncontrolled with hyperglycemia: HbA1c 7.6%.  ?- Continue SSI. Glycemic control much better. ? ?Subjective: Still having nausea, abdominal pain that extends from upper middle abdomen involving a large portion of the abdomen to the right side predominantly. Nauseated, not controlled with compazine alone. Still spitting up any attempts at po.  ? ?Objective: ?Vitals:  ? 07/25/21 1448 07/25/21 1529 07/25/21 2335 07/26/21 0335  ?BP:  132/87 122/72 119/70  ?Pulse:  81 68 (!) 58  ?Resp:  16 20 18   ?Temp: 98.6 ?F (37 ?C) 98.4 ?F (36.9 ?C) 99.1 ?F (37.3 ?C) 98.2 ?F (36.8 ?C)  ?TempSrc: Oral Oral Oral Oral  ?SpO2:  100% 99% 99%  ?Weight:   98.3 kg   ?Height:   6\' 2"   (1.88 m)   ? ?Gen:  51 y.o. male in no distress ?Pulm: Nonlabored breathing room air. Clear ?CV: Regular rate and rhythm. No murmur, rub, or gallop. No JVD, no dependent edema. ?GI: Abdomen soft, diffusely tender without rebound or guarding, non-distended, with normoactive bowel sounds.  ?Ext: Warm, no deformities ?Skin: No rashes, lesions or ulcers on visualized skin. ?Neuro: Alert and oriented. No focal neurological deficits. ?Psych: Judgement and insight appear fair. Mood euthymic & affect congruent. Behavior is appropriate.   ? ?Data Personally reviewed: ? ?CBC: ?Recent Labs  ?Lab 07/25/21 ?0930 07/26/21 ?0423  ?WBC 8.2 8.1  ?NEUTROABS 5.4  --   ?HGB 16.2 14.1  ?HCT 46.4 40.9  ?MCV 87.5 89.1  ?PLT 249 197  ? ?Basic Metabolic Panel: ?Recent Labs  ?Lab 07/25/21 ?0930 07/26/21 ?0423  ?NA 132* 139  ?K 3.5 4.1  ?CL 97* 105  ?CO2 25 28  ?GLUCOSE 350* 194*  ?BUN 42* 24*  ?CREATININE 2.25* 1.29*  ?CALCIUM 9.7 9.3  ? ?GFR: ?Estimated Creatinine Clearance: 79.7 mL/min (A) (by C-G formula based on SCr of 1.29 mg/dL (H)). ?Liver Function Tests: ?Recent Labs  ?Lab 07/25/21 ?0930 07/26/21 ?0423  ?AST 27 26  ?ALT 41 35  ?ALKPHOS 74 58  ?BILITOT 1.0 1.0  ?PROT 8.6* 6.6  ?ALBUMIN 5.2* 4.2  ? ?Recent Labs  ?Lab 07/25/21 ?0930  ?LIPASE 32  ? ?No results for input(s): AMMONIA in the last 168 hours. ?Coagulation Profile: ?No results for  input(s): INR, PROTIME in the last 168 hours. ?Cardiac Enzymes: ?Recent Labs  ?Lab 07/25/21 ?0930  ?CKTOTAL 459*  ? ?BNP (last 3 results) ?No results for input(s): PROBNP in the last 8760 hours. ?HbA1C: ?Recent Labs  ?  07/25/21 ?0930  ?HGBA1C 7.6*  ? ?CBG: ?Recent Labs  ?Lab 07/25/21 ?1747 07/25/21 ?2112 07/26/21 ?0749 07/26/21 ?1134 07/26/21 ?1709  ?GLUCAP 193* 194* 175* 197* 161*  ? ?Lipid Profile: ?No results for input(s): CHOL, HDL, LDLCALC, TRIG, CHOLHDL, LDLDIRECT in the last 72 hours. ?Thyroid Function Tests: ?No results for input(s): TSH, T4TOTAL, FREET4, T3FREE, THYROIDAB in the last 72  hours. ?Anemia Panel: ?No results for input(s): VITAMINB12, FOLATE, FERRITIN, TIBC, IRON, RETICCTPCT in the last 72 hours. ?Urine analysis: ?   ?Component Value Date/Time  ? COLORURINE YELLOW 07/25/2021 1020  ? APPEARANCEUR CLEAR 07/25/2021 1020  ? LABSPEC 1.021 07/25/2021 1020  ? PHURINE 5.0 07/25/2021 1020  ? GLUCOSEU >=500 (A) 07/25/2021 1020  ? HGBUR NEGATIVE 07/25/2021 1020  ? BILIRUBINUR NEGATIVE 07/25/2021 1020  ? KETONESUR NEGATIVE 07/25/2021 1020  ? PROTEINUR NEGATIVE 07/25/2021 1020  ? NITRITE NEGATIVE 07/25/2021 1020  ? LEUKOCYTESUR NEGATIVE 07/25/2021 1020  ? ?No results found for this or any previous visit (from the past 240 hour(s)).   ?CT Renal Stone Study ? ?Result Date: 07/25/2021 ?CLINICAL DATA:  51 year old male with a history of flank pain and suspected kidney stone EXAM: CT ABDOMEN AND PELVIS WITHOUT CONTRAST TECHNIQUE: Multidetector CT imaging of the abdomen and pelvis was performed following the standard protocol without IV contrast. RADIATION DOSE REDUCTION: This exam was performed according to the departmental dose-optimization program which includes automated exposure control, adjustment of the mA and/or kV according to patient size and/or use of iterative reconstruction technique. COMPARISON:  08/19/2020 FINDINGS: Lower chest: No acute finding of the lower chest Hepatobiliary: Unremarkable liver.  Cholecystectomy Pancreas: Unremarkable Spleen: Unremarkable Adrenals/Urinary Tract: Unremarkable adrenal glands. Right kidney with no hydronephrosis or nephrolithiasis. Unremarkable right ureter. Similar appearance of fluid density cystic structure on the posterior cortex. Left kidney without hydronephrosis or nephrolithiasis. Unremarkable left ureter. Unremarkable urinary bladder Stomach/Bowel: - Stomach: Unremarkable. - Small bowel: Unremarkable - Appendix: Normal. - Colon: No significant stool burden.  No inflammatory changes. Vascular/Lymphatic: Minimal atherosclerotic changes of the abdominal  aorta. No aneurysm. No mesenteric or retroperitoneal lymphadenopathy. No pelvic lymphadenopathy. Reproductive: Unremarkable prostate Other: None Musculoskeletal: No acute displaced fracture. Mild degenerative changes of the lumbar spine. IMPRESSION: No acute finding of the abdomen/pelvis, with no evidence of nephrolithiasis. Electronically Signed   By: Gilmer Mor D.O.   On: 07/25/2021 11:04   ? ? ?Family Communication: None at bedside ? ?Disposition: ?Status is: Inpatient ?Remains inpatient appropriate because: Persistent IV fluid requirement. ?Planned Discharge Destination: Home ? ? ? ? ? ?Tyrone Nine, MD ?07/26/2021 6:16 PM ?Page by [email or web address]  ?

## 2021-07-27 DIAGNOSIS — N179 Acute kidney failure, unspecified: Secondary | ICD-10-CM | POA: Diagnosis not present

## 2021-07-27 DIAGNOSIS — I10 Essential (primary) hypertension: Secondary | ICD-10-CM | POA: Diagnosis not present

## 2021-07-27 DIAGNOSIS — R112 Nausea with vomiting, unspecified: Secondary | ICD-10-CM | POA: Diagnosis not present

## 2021-07-27 DIAGNOSIS — E1165 Type 2 diabetes mellitus with hyperglycemia: Secondary | ICD-10-CM | POA: Diagnosis not present

## 2021-07-27 LAB — BASIC METABOLIC PANEL
Anion gap: 4 — ABNORMAL LOW (ref 5–15)
BUN: 12 mg/dL (ref 6–20)
CO2: 27 mmol/L (ref 22–32)
Calcium: 8.9 mg/dL (ref 8.9–10.3)
Chloride: 106 mmol/L (ref 98–111)
Creatinine, Ser: 0.95 mg/dL (ref 0.61–1.24)
GFR, Estimated: >60 mL/min (ref >60)
Glucose, Bld: 187 mg/dL — ABNORMAL HIGH (ref 70–99)
Potassium: 3.7 mmol/L (ref 3.5–5.1)
Sodium: 137 mmol/L (ref 135–145)

## 2021-07-27 LAB — GLUCOSE, CAPILLARY
Glucose-Capillary: 194 mg/dL — ABNORMAL HIGH (ref 70–99)
Glucose-Capillary: 142 mg/dL — ABNORMAL HIGH (ref 70–99)

## 2021-07-27 MED ORDER — PANTOPRAZOLE SODIUM 40 MG PO TBEC
40.0000 mg | DELAYED_RELEASE_TABLET | Freq: Every day | ORAL | Status: DC
  Administered 2021-07-27: 40 mg via ORAL
  Filled 2021-07-27: qty 1

## 2021-07-27 MED ORDER — ONDANSETRON HCL 4 MG PO TABS: 4.0000 mg | ORAL_TABLET | Freq: Three times a day (TID) | ORAL | 0 refills | Status: AC | PRN

## 2021-07-27 MED ORDER — PANTOPRAZOLE SODIUM 40 MG PO TBEC
40.0000 mg | DELAYED_RELEASE_TABLET | Freq: Every day | ORAL | 0 refills | Status: AC
Start: 2021-07-27 — End: ?

## 2021-07-27 NOTE — Progress Notes (Signed)
PHARMACIST - PHYSICIAN COMMUNICATION ? ?DR:   Bonner Puna ? ?CONCERNING: IV to Oral Route Change Policy ? ?RECOMMENDATION: ?This patient is receiving pantoprazole by the intravenous route.  Based on criteria approved by the Pharmacy and Therapeutics Committee, the intravenous medication(s) is/are being converted to the equivalent oral dose form(s). ? ? ?DESCRIPTION: ?These criteria include: ?The patient is eating (either orally or via tube) and/or has been taking other orally administered medications for a least 24 hours ?The patient has no evidence of active gastrointestinal bleeding or impaired GI absorption (gastrectomy, short bowel, patient on TNA or NPO). ? ?If you have questions about this conversion, please contact the Pharmacy Department  ?[]   [phone] )  [facility] ?[]   [phone] )  [facility] ?[]   [phone] )  [facility] ?[]   [phone] )  [facility] ?[x]   [phone] )  [facility]  ? ?Dimple Nanas, [facility] ?07/27/2021 9:56 AM ? ?

## 2021-07-27 NOTE — Progress Notes (Signed)
? ?  Transition of Care (TOC) Screening Note ? ? ?Patient Details  ?Name: Edward Wise ?Date of Birth: 1970-05-19 ? ? ?Transition of Care (TOC) CM/SW Contact:    ?Lavenia Atlas, RN ?Phone Number: ?07/27/2021, 11:41 AM ? ? ? ?Transition of Care Department [facility]) has reviewed patient and no TOC needs have been identified at this time. We will continue to monitor patient advancement through interdisciplinary progression rounds. If new patient transition needs arise, please place a TOC consult. ?  ?

## 2021-07-27 NOTE — Discharge Summary (Signed)
?Physician Discharge Summary ?  ?Patient: Edward Wise MRN: [id number] DOB: 1970/06/21  ?Admit date:     07/25/2021  ?Discharge date: 07/27/21  ?Discharge Physician: Edward Wise  ? ?PCP: Pcp, No  ? ?Recommendations at discharge:  ?Continue HTN and T2DM management per PCP.  ?Consider GI evaluation for epigastric discomfort with negative CT abd/pelvis. Empiric protonix started.  ? ?Discharge Diagnoses: ?Principal Problem: ?  AKI (acute kidney injury) ([place]) ?Active Problems: ?  Diabetes mellitus with hyperglycemia ([place]) ?  Hypertension ?  Abdominal pain ?  Nausea & vomiting ? ?Hospital Course: ?Edward Wise is a 51 y.o. male with medical history significant of HTN, DM2. Presenting with abdominal pain, N/V. Reports his symptoms started 3 days ago. It started as an epigastric/RUQ crampy pain. It made him feel nauseous. He tried zofran, but it didn't help. He hasn't been able to keep anything down since. He says this pain reminds him of when he had gallbladder issues, but he had his gallbladder removed a year ago. He hasn't had any fevers or sick contacts. No diarrhea. When his symptoms didn't improve this morning, he came to the ED for assistance. He denies any other aggravating or alleviating factors.  ? ?Work up here was largely reassuring with no explanation found on CT abd/pelvis. He was given IV fluids with resolution of AKI, and with antiemetic support, he has successfully advanced diet PPI was given and will be continued empirically at discharge.  ? ?Assessment and Plan: ?Nausea, vomiting, abdominal pain: No explanation of symptoms on CT abd, and exam currently is tender but improving and certainly not peritonitic. Tolerating diet with improved symptoms. Unclear whether this was reflux/gastritis/dyspepsia vs. viral gastroenteritis which should continue improving.  ?- Rx pantoprazole daily and prn zofran. No longer requires IV medications or fluids and is stable for discharge.  ?  ?AKI: Prerenal azotemia due to  dehydration from vomiting, no po intake. Resolved with IVF.  ?- Ok to restart home medications.   ? ?HTN: Restart home medications ?  ?Hyponatremia: Resolved with isotonic saline.  ?  ?T2DM uncontrolled with hyperglycemia: HbA1c 7.6%.  ?- Continue home oral med and PCP follow up. ? ?Consultants: None ?Procedures performed: None  ?Disposition: Home ?Diet recommendation:  ?Discharge Diet Orders (From admission, onward)  ? ?  Start     Ordered  ? 07/27/21 0000  Diet Carb Modified       ? 07/27/21 1131  ? ?  ?  ? ?  ? ?Cardiac and Carb modified diet ?DISCHARGE MEDICATION: ?Allergies as of 07/27/2021   ? ?   Reactions  ? Penicillins Hives  ? ?  ? ?  ?Medication List  ?  ? ?TAKE these medications   ? ?acetaminophen 500 MG tablet ?Commonly known as: TYLENOL ?Take 1,000 mg by mouth every 6 (six) hours as needed. ?  ?baclofen 10 MG tablet ?Commonly known as: LIORESAL ?TAKE 1/2 TO 1 TABLET BY MOUTH AT BEDTIME AS NEEDED FOR MUSCLE SPASMS. ?What changed: See the new instructions. ?  ?blood glucose meter kit and supplies ?Dispense based on patient and insurance preference. Use up to four times daily as directed. (FOR ICD-10 E10.9, E11.9). ?  ?diphenhydrAMINE 25 MG tablet ?Commonly known as: BENADRYL ?Take 25 mg by mouth daily as needed for allergies. ?  ?glimepiride 2 MG tablet ?Commonly known as: AMARYL ?Take 2 mg by mouth daily. ?  ?hydrochlorothiazide 25 MG tablet ?Commonly known as: HYDRODIURIL ?Take 25 mg by mouth daily. ?  ?losartan 50 MG  tablet ?Commonly known as: COZAAR ?Take 1 tablet (50 mg total) by mouth daily. ?  ?multivitamin with minerals Tabs tablet ?Take 1 tablet by mouth daily. ?  ?ondansetron 4 MG tablet ?Commonly known as: ZOFRAN ?Take 1 tablet (4 mg total) by mouth every 8 (eight) hours as needed for nausea or vomiting. ?  ?pantoprazole 40 MG tablet ?Commonly known as: PROTONIX ?Take 1 tablet (40 mg total) by mouth daily. ?  ?polyethylene glycol 17 g packet ?Commonly known as: MIRALAX / GLYCOLAX ?Take 17 g by  mouth daily as needed for mild constipation. ?  ?potassium chloride SA 20 MEQ tablet ?Commonly known as: KLOR-CON M ?Take 1 tablet (20 mEq total) by mouth daily. ?  ? ?  ? ? ?Discharge Exam: ?Filed Weights  ? 07/25/21 2335  ?Weight: 98.3 kg  ?BP (!) 142/98 (BP Location: Left Arm)   Pulse 63   Temp 99.2 ?F (37.3 ?C) (Oral)   Resp 18   Ht _0  (1.88 m)   Wt 98.3 kg   SpO2 98%   BMI 27.82 kg/m?   ?Well-appearing, WDWN male in no distress ?Clear, nonlabored ?RRR no edema ?Soft, tender in Right quadrants and epigastrium without rebound or guarding or rigidity. No palpable masses with attention to LLQ which is nontender.  ? ?Condition at discharge: stable ? ?The results of significant diagnostics from this hospitalization (including imaging, microbiology, ancillary and laboratory) are listed below for reference.  ? ?Imaging Studies: ?CT Renal Stone Study ? ?Result Date: 07/25/2021 ?CLINICAL DATA:  51 year old male with a history of flank pain and suspected kidney stone EXAM: CT ABDOMEN AND PELVIS WITHOUT CONTRAST TECHNIQUE: Multidetector CT imaging of the abdomen and pelvis was performed following the standard protocol without IV contrast. RADIATION DOSE REDUCTION: This exam was performed according to the departmental dose-optimization program which includes automated exposure control, adjustment of the mA and/or kV according to patient size and/or use of iterative reconstruction technique. COMPARISON:  08/19/2020 FINDINGS: Lower chest: No acute finding of the lower chest Hepatobiliary: Unremarkable liver.  Cholecystectomy Pancreas: Unremarkable Spleen: Unremarkable Adrenals/Urinary Tract: Unremarkable adrenal glands. Right kidney with no hydronephrosis or nephrolithiasis. Unremarkable right ureter. Similar appearance of fluid density cystic structure on the posterior cortex. Left kidney without hydronephrosis or nephrolithiasis. Unremarkable left ureter. Unremarkable urinary bladder Stomach/Bowel: - Stomach:  Unremarkable. - Small bowel: Unremarkable - Appendix: Normal. - Colon: No significant stool burden.  No inflammatory changes. Vascular/Lymphatic: Minimal atherosclerotic changes of the abdominal aorta. No aneurysm. No mesenteric or retroperitoneal lymphadenopathy. No pelvic lymphadenopathy. Reproductive: Unremarkable prostate Other: None Musculoskeletal: No acute displaced fracture. Mild degenerative changes of the lumbar spine. IMPRESSION: No acute finding of the abdomen/pelvis, with no evidence of nephrolithiasis. Electronically Signed   By: Corrie Mckusick D.O.   On: 07/25/2021 11:04   ? ?Microbiology: ?Results for orders placed or performed during the hospital encounter of 08/19/20  ?Resp Panel by RT-PCR (Flu A&B, Covid) Nasopharyngeal Swab     Status: None  ? Collection Time: 08/19/20  4:53 PM  ? Specimen: Nasopharyngeal Swab; Nasopharyngeal(NP) swabs in vial transport medium  ?Result Value Ref Range Status  ? SARS Coronavirus 2 by RT PCR NEGATIVE NEGATIVE Final  ?  Comment: (NOTE) ?SARS-CoV-2 target nucleic acids are NOT DETECTED. ? ?The SARS-CoV-2 RNA is generally detectable in upper respiratory ?specimens during the acute phase of infection. The lowest ?concentration of SARS-CoV-2 viral copies this assay can detect is ?138 copies/mL. A negative result does not preclude SARS-Cov-2 ?infection and should not be used  as the sole basis for treatment or ?other patient management decisions. A negative result may occur with  ?improper specimen collection/handling, submission of specimen other ?than nasopharyngeal swab, presence of viral mutation(s) within the ?areas targeted by this assay, and inadequate number of viral ?copies(<138 copies/mL). A negative result must be combined with ?clinical observations, patient history, and epidemiological ?information. The expected result is Negative. ? ?Fact Sheet for Patients:  ?EntrepreneurPulse.com.au ? ?Fact Sheet for Healthcare Providers:   ?IncredibleEmployment.be ? ?This test is no t yet approved or cleared by the [place] FDA and  ?has been authorized for detection and/or diagnosis of SARS-CoV-2 by ?FDA under an Emergency Use Authorization ([place]
